# Patient Record
Sex: Female | Born: 1995
Health system: Southern US, Community
[De-identification: ages and names within clinical notes are randomized; demographics above are authoritative.]

## PROBLEM LIST (undated history)

## (undated) DIAGNOSIS — I871 Compression of vein: Secondary | ICD-10-CM

## (undated) HISTORY — DX: Compression of vein: I87.1

## (undated) HISTORY — PX: NEPHRECTOMY: SHX65

## (undated) HISTORY — PX: TONSILLECTOMY: SUR1361

## (undated) HISTORY — PX: OVARIAN CYST REMOVAL: SHX89

---

## 2020-04-20 DIAGNOSIS — Z905 Acquired absence of kidney: Secondary | ICD-10-CM

## 2020-04-20 DIAGNOSIS — N96 Recurrent pregnancy loss: Secondary | ICD-10-CM | POA: Insufficient documentation

## 2020-04-20 HISTORY — DX: Recurrent pregnancy loss: N96

## 2020-04-20 HISTORY — DX: Acquired absence of kidney: Z90.5

## 2020-04-20 LAB — OB RESULTS CONSOLE HIV ANTIBODY (ROUTINE TESTING): HIV: NONREACTIVE

## 2020-04-20 LAB — OB RESULTS CONSOLE HEPATITIS B SURFACE ANTIGEN: Hepatitis B Surface Ag: NEGATIVE

## 2020-04-20 LAB — OB RESULTS CONSOLE VARICELLA ZOSTER ANTIBODY, IGG: Varicella: IMMUNE

## 2020-04-20 LAB — OB RESULTS CONSOLE ABO/RH: RH Type: POSITIVE

## 2020-04-20 LAB — OB RESULTS CONSOLE ANTIBODY SCREEN: Antibody Screen: NEGATIVE

## 2020-04-20 LAB — OB RESULTS CONSOLE PLATELET COUNT: Platelets: 328

## 2020-04-20 LAB — OB RESULTS CONSOLE HGB/HCT, BLOOD
HCT: 38 (ref 29–41)
Hemoglobin: 12.7

## 2020-04-20 LAB — OB RESULTS CONSOLE RPR: RPR: NONREACTIVE

## 2020-04-20 LAB — OB RESULTS CONSOLE RUBELLA ANTIBODY, IGM: Rubella: IMMUNE

## 2020-04-20 LAB — OB RESULTS CONSOLE TSH: TSH: 0.543

## 2020-04-22 LAB — CYTOLOGY - PAP: Pap: NEGATIVE

## 2020-07-06 ENCOUNTER — Encounter: Payer: Self-pay | Admitting: *Deleted

## 2020-07-08 ENCOUNTER — Encounter: Payer: Self-pay | Admitting: General Practice

## 2020-07-13 ENCOUNTER — Encounter: Payer: Self-pay | Admitting: Certified Nurse Midwife

## 2020-07-13 ENCOUNTER — Ambulatory Visit (INDEPENDENT_AMBULATORY_CARE_PROVIDER_SITE_OTHER): Payer: 59 | Admitting: Certified Nurse Midwife

## 2020-07-13 ENCOUNTER — Other Ambulatory Visit: Payer: Self-pay

## 2020-07-13 VITALS — BP 106/69 | HR 74 | Ht 65.0 in

## 2020-07-13 DIAGNOSIS — O0990 Supervision of high risk pregnancy, unspecified, unspecified trimester: Secondary | ICD-10-CM

## 2020-07-13 DIAGNOSIS — N949 Unspecified condition associated with female genital organs and menstrual cycle: Secondary | ICD-10-CM

## 2020-07-13 DIAGNOSIS — Z3A19 19 weeks gestation of pregnancy: Secondary | ICD-10-CM

## 2020-07-13 HISTORY — DX: Supervision of high risk pregnancy, unspecified, unspecified trimester: O09.90

## 2020-07-13 LAB — POCT URINALYSIS DIP (DEVICE)
Bilirubin Urine: NEGATIVE
Glucose, UA: NEGATIVE mg/dL
Hgb urine dipstick: NEGATIVE
Ketones, ur: 15 mg/dL — AB
Leukocytes,Ua: NEGATIVE
Nitrite: NEGATIVE
Protein, ur: NEGATIVE mg/dL
Specific Gravity, Urine: 1.025 (ref 1.005–1.030)
Urobilinogen, UA: 0.2 mg/dL (ref 0.0–1.0)
pH: 7 (ref 5.0–8.0)

## 2020-07-13 MED ORDER — MAG-OXIDE 200 MG PO TABS: 400.0000 mg | ORAL_TABLET | Freq: Every day | ORAL | 3 refills | Status: DC

## 2020-07-13 NOTE — Progress Notes (Signed)
   PRENATAL VISIT NOTE  Subjective:  Barbara Reed is a 24 y.o. G5P0040 at [redacted]w[redacted]d being seen today for her first prenatal visit for this pregnancy.  She is currently monitored for the following issues for this high-risk pregnancy and has Supervision of high risk pregnancy, antepartum on their problem list.  Patient reports intermittent abdominal pains, usually with movement and occasionally with stress.   .  .   . Denies leaking of fluid.   She is planning to breastfeed. She is unsure about contraception.   The following portions of the patient's history were reviewed and updated as appropriate: allergies, current medications, past family history, past medical history, past social history, past surgical history and problem list.   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  Objective:  There were no vitals filed for this visit.  Fetal Status:           General:  Alert, oriented and cooperative. Patient is in no acute distress.  Skin: Skin is warm and dry. No rash noted.   Cardiovascular: Normal heart rate and rhythm noted  Respiratory: Normal respiratory effort, no problems with respiration noted. Clear to auscultation.   Abdomen: Soft, gravid, appropriate for gestational age. Normal bowel sounds. Non-tender.       Pelvic: Cervical exam deferred       Normal cervical contour, no lesions, no bleeding following pap, normal discharge  Extremities: Normal range of motion.     Mental Status: Normal mood and affect. Normal behavior. Normal judgment and thought content.   Assessment and Plan:  Pregnancy: G5P0040 at [redacted]w[redacted]d 1. Supervision of high risk pregnancy, antepartum - Blood work  completed at previous OB, results on file - [facility] AMB BABYSCRIPTS SCHEDULE OPTIMIZATION - Culture, OB Urine - Discussed the normal visit cadence for prenatal care including in-person and virtual visits - Discussed the nature of our practice with multiple providers including residents and students - Encouraged vaccination against Covid-19, patient declined. She had Covid in May 2021. - Also encouraged vaccination against flu, pt prefers to wait until later in the flu season.  2. [redacted] weeks gestation of pregnancy - [place] MFM OB DETAIL +14 WK; Future  3. Round ligament pain - Magnesium Oxide (MAG-OXIDE) 200 MG TABS; Take 2 tablets (400 mg total) by mouth at bedtime. If that amount causes loose stools in the am, switch to 200mg  daily at bedtime.  Dispense: 60 tablet; Refill: 3 - Discussed how scar tissue and round ligaments stretch and can cause pain during pregnancy as the uterus grows. Encouraged ways to decrease the pain.  Preterm labor/first trimester warning symptoms and general obstetric precautions including but not limited to vaginal bleeding, contractions, leaking of fluid and fetal movement were reviewed in detail with the patient. Please refer to After Visit Summary for other counseling recommendations.   Return in about 4 weeks (around 08/10/2020) for VIRTUAL, HROB.  Future Appointments  Date Time Provider Department Center  07/23/2020 10:45 AM [facility] NURSE [facility]  07/23/2020 11:00 AM [facility] US1 [facility]  08/10/2020  9:15 AM 10/10/2020, MD [facility]   [facility] CNM, MSN, [facility] 07/13/20 1:20 PM

## 2020-07-15 LAB — URINE CULTURE, OB REFLEX

## 2020-07-15 LAB — CULTURE, OB URINE

## 2020-07-23 ENCOUNTER — Ambulatory Visit: Payer: 59 | Attending: Certified Nurse Midwife

## 2020-07-23 ENCOUNTER — Other Ambulatory Visit: Payer: Self-pay | Admitting: *Deleted

## 2020-07-23 ENCOUNTER — Other Ambulatory Visit: Payer: Self-pay

## 2020-07-23 ENCOUNTER — Ambulatory Visit: Payer: 59 | Admitting: *Deleted

## 2020-07-23 DIAGNOSIS — Z3A19 19 weeks gestation of pregnancy: Secondary | ICD-10-CM | POA: Diagnosis not present

## 2020-07-23 DIAGNOSIS — Z3A20 20 weeks gestation of pregnancy: Secondary | ICD-10-CM | POA: Diagnosis not present

## 2020-07-23 DIAGNOSIS — Z362 Encounter for other antenatal screening follow-up: Secondary | ICD-10-CM

## 2020-07-23 DIAGNOSIS — O0992 Supervision of high risk pregnancy, unspecified, second trimester: Secondary | ICD-10-CM | POA: Insufficient documentation

## 2020-07-23 DIAGNOSIS — O0990 Supervision of high risk pregnancy, unspecified, unspecified trimester: Secondary | ICD-10-CM

## 2020-08-10 ENCOUNTER — Encounter: Payer: Self-pay | Admitting: Family Medicine

## 2020-08-10 ENCOUNTER — Other Ambulatory Visit: Payer: Self-pay

## 2020-08-10 ENCOUNTER — Ambulatory Visit (INDEPENDENT_AMBULATORY_CARE_PROVIDER_SITE_OTHER): Payer: 59 | Admitting: Family Medicine

## 2020-08-10 VITALS — BP 106/75 | HR 92 | Wt 144.6 lb

## 2020-08-10 DIAGNOSIS — R002 Palpitations: Secondary | ICD-10-CM

## 2020-08-10 DIAGNOSIS — N96 Recurrent pregnancy loss: Secondary | ICD-10-CM

## 2020-08-10 DIAGNOSIS — Z1331 Encounter for screening for depression: Secondary | ICD-10-CM

## 2020-08-10 DIAGNOSIS — O0990 Supervision of high risk pregnancy, unspecified, unspecified trimester: Secondary | ICD-10-CM

## 2020-08-10 DIAGNOSIS — Z905 Acquired absence of kidney: Secondary | ICD-10-CM

## 2020-08-10 NOTE — Patient Instructions (Addendum)
AREA PEDIATRIC/FAMILY PRACTICE PHYSICIANS  [place] ([address]) . [facility] o Chambliss, MD; Eniola, MD; Hale, MD; Hensel, MD; McDiarmid, MD; McIntyer, MD; Neal, MD; Walden, MD o [address] [place], [place] [address] o [phone] o Mon-Fri 8:30-12:30, 1:30-5:00 o Providers come to see babies at [facility] o Accepting Medicaid . [facility] o Limited providers who accept newborns: Koirala, MD; Morrow, MD; Wolters, MD o [address], [place], [place] [address] o [phone] o Mon-Fri 8:00-5:30 o Babies seen by providers at [facility] o Does NOT accept Medicaid o Please call early in hospitalization for appointment (limited availability)  . [facility] o [place], MD o [address] [place], [place] [address] o [phone] o Mon, Tue, Thur, Fri 8:30-5:00, Wed 10:00-7:00 (closed 1-2pm) o Babies seen by [facility] providers o Accepting Medicaid . [facility] o Rubin, MD o [address], [place], [place] [address] o [phone] o Mon-Fri 8:30-5:00, Sat 8:30-12:00 o Provider comes to see babies at [facility] o Accepting Medicaid o Must have been referred from current patients or contacted office prior to delivery . [facility] ([facility]) o Brown, MD; Chandler, MD; Ettefagh, MD; Grant, MD; Lester, MD; McCormick, MD; McQueen, MD; Prose, MD; Simha, MD; Stanley, MD; Stryffeler, NP; Tebben, NP o [address], [place], [place] [address] o [phone] o Mon, Tue, Thur, Fri 8:30-5:30, Wed 9:30-5:30, Sat 8:30-12:30 o Babies seen by [facility] providers o Accepting Medicaid o Only accepting infants of first-time parents or siblings of current patients o Hospital discharge coordinator will make follow-up appointment . Jack Amos o [address],  [place], [place]  [address] o [phone]   Fax - [phone] . [facility] o [address], [place], [place]  [address] o Phone - [phone]   Fax - [phone] . Shilpa Gosrani o [address], [place], [place]  [address] o [phone]  [place] ([address]) . [facility] o Bates, MD; Brassfield, MD; Cooper, Cox, MD; MD; Davis, MD; Dovico, MD; Ettefaugh, MD; Little, MD; Lowe, MD; Keiffer, MD; Melvin, MD; Sumner, MD; Williams, MD o [address], [place], [place] [address] o [phone] o Mon-Fri 8:30-5:00 (extended evenings Mon-Thur as needed), Sat-Sun 10:00-1:00 o Providers come to see babies at [facility] o Accepting Medicaid for families of first-time babies and families with all children in the household age 3 and under. Must register with office prior to making appointment (M-F only). . [facility] o Henson, NP; Knapp, MD; Lalonde, MD; Tysinger, PA o [address] [place], [place] [address] o [phone] o Mon-Fri 8:00-5:00 o Babies seen by providers at [facility] o Does NOT accept Medicaid/Commercial Insurance Only . [facility] ([facility]  o Artis, MD; Barnes, MD; Bratton, MD; Coccaro, MD; [place] Gardner, MD; Kramer, MD; Marshall, MD; Netherton, MD; Poleto, MD; Skinner, MD o [address] [place], [place] [address] o [phone] o Mon-Fri 8:30-5:30, Sat (Oct.-Mar.) 9:00-1:00 o Babies seen by providers at [facility] o Accepting Medicaid  [place] ([address]) . [facility]  o Reid, MD; Warner, MD o [address], , [place] [address] o [phone] o Mon-Fri 8:30-5:00, Sat 8:30-12:00 o Providers come to see babies at [facility] o Does NOT accept Medicaid . [facility] o Becker, PA; Hagler, MD; Scifres, PA; Sun, MD; Swayne, MD o [address],  [place], [place] [address] o [phone] o Mon-Fri 8:00-5:00 o Babies seen by providers at [facility] o Does NOT accept Medicaid o Only accepting babies of parents who are patients o Please call early in hospitalization for appointment (limited availability) . [facility] o Clark, MD; Frye, MD; Kelleher, MD; Mack, NP; Miller, MD; O'Keller, MD; Patterson, NP; Pudlo, MD; Puzio, MD; Thomas, MD; Tucker, MD; Twiselton, MD o [address], [place], [place] [address] o [phone] o Mon-Fri 8:00-5:00, Sat 9:00-12:00 o Providers come to see babies at [facility] o Does NOT accept Medicaid  [place] ([address]) . [facility] o Limited providers accepting new patients: Brake, NP; Wharton, PA o [address], [place], [place] [address] o [phone] o Mon-Fri 8:00-5:00 o Babies seen by providers at [facility] o Does NOT accept Medicaid o Only accepting babies of parents who are patients o Please call early in hospitalization for appointment (limited availability) . [facility] o Gay, MD; Quinlan, MD o [address] [place], [place] [address] o [phone] (press 1 to schedule appointment) o Mon-Fri 8:00-5:00 o Providers come to see babies at [facility] o Does NOT accept Medicaid . [facility] o Mazer, MD o [address] [place], [place] [address] o [phone] o Mon-Fri 8:30-5:00 (lunch 12:30-1:00), extended hours by appointment only Wed 5:00-6:30 o Babies seen by [facility] providers o Accepting Medicaid . [facility] o Banks, MD; Jordan, MD; Koberlein, MD o [address], [place], [place] [address] o [phone] o Mon-Fri 8:00-5:00 o Babies seen by [facility] providers o Does NOT accept Medicaid .  [facility] o Parker, MD; Hunter, MD; Wallace, DO o [address] [place], [place]  [address] o [phone] o Mon-Fri 8:00-5:00 o Babies seen by [facility] providers o Does NOT accept Medicaid . [facility] o Brandon, PA; Brecken, PA; Christy, NP; Dees, MD; DeClaire, MD; DeWeese, MD; Hansen, NP; Mills, NP; Parrish, NP; Smoot, NP; Summer, MD; Vapne, MD o [address] [place], [place] [address] o [phone] o Mon-Fri 8:30-5:00, Sat 10:00-1:00 o Providers come to see babies at [facility] o Does NOT accept Medicaid o Free prenatal information session Tuesdays at 4:45pm . [facility] o Bouska, MD; Gordon, PA; Jeffery, PA; Weber, PA o [address] [place] [address] o [phone] o Mon-Fri 7:30-5:30 o Babies seen by [facility] providers . [facility] o [address], [place], [place]  [address] o [phone]   Fax - [phone]  [place] ([address] & [address]) . [facility] o [place]ese, MD o [address] [place], [place] [address] o [phone] o Mon-Thur 8:00-6:00 o Providers come to see babies at [facility] o Accepting Medicaid . [facility] o Anderson, NP; Badger, MD; Beal, PA; Spencer, PA o [address] [place], [place] [address] o [phone] o Mon-Thur 7:30-7:30, Fri 7:30-4:30 o Babies seen by [facility] providers o Accepting Medicaid . [facility] o Agbuya, MD; Klett, NP; Romgoolam, MD o [address], [place],  [address] o [phone] o Mon-Fri 8:30-5:00, Sat 8:30-12:00 o Providers come to see babies at [facility] o Accepting Medicaid o Must have "Meet & Greet" appointment at office prior to delivery . [facility] o McCord,   MD; Wallace, MD; Wood, MD o [address], [place], [place] [address] o [phone] o Mon-Wed 8:00-6:00, Thur-Fri 8:00-5:00, Sat 9:00-12:00 o Providers come to  see babies at [facility] o Does NOT accept Medicaid o Only accepting siblings of current patients . [facility]  o [address], [place], [place]  [address] o [phone]   Fax - [phone] . [facility] o [address], [place], [place]  [address] o [phone]   Fax - [phone]  [place] ([address] & [address]) . [facility] o Cirigliano, DO; Matthews, DO o [address] [place], [place] [address] o [phone] o Mon-Fri 7:00-5:00 o Babies seen by [facility] providers o Does NOT accept Medicaid . [facility] o Briscoe, MD; Howley, PA; Moreira, PA o [address], [place], [place] [address] o [phone] o Mon-Fri 8:00-5:00 o Babies seen by [facility] providers o Accepting Medicaid . [facility] o Boyd, MD; Church, PA; Jones, NP; Osborn, PA o [address], [place], [place] [address] o [phone] o Mon-Fri 8:00-5:00 o Babies seen by providers at [facility] o Accepting Medicaid  [place] ([address]) . [facility] [place] o [place], DO o [address] [place], [place] [address] o [phone] o Mon-Fri 8:00-5:00 o Babies seen by [facility] providers o Does NOT accept Medicaid o Limited availability, please call early in hospitalization to schedule follow-up . [facility] o Calderon, PA; Cummings, MD; Dillard, MD; Martin, PA; Olson, MD; VanDeven, PA o [address] [place] [address], [place], [place] [address] o [phone] o Mon-Fri 8:30-5:00, Sat 9:00-12:00 o Babies seen by providers at [facility] o Accepting Medicaid o Please register online then schedule online or call office o [email or web address] . [facility] o Hunter, NP; Kumar, MD; Martin Rogers, PA o [address], [place], [place] [address] o [phone] o Mon-Fri 8:00-5:00 o Babies seen by providers at [facility] o Accepting Medicaid . [facility] ([facility] o [place], MD; Kristi Fleenor, NP; West, MD o [address], [place], [place] [address] o [phone] o Mon-Fri 8:00-5:30, Sat&Sun by appointment (phones open at 8:30) o Babies seen by [facility] providers o Accepting Medicaid o Must be a first-time baby or sibling of current patient . [facility] [place]  o [address], [place], [place]  [address] o [phone]   Fax - [phone]  [place] ([address]) . [facility] o Brown, PA; Cowen, PA; Rice, MD; Helton, PA; Spry, MD o [address] [place], [place] [address] o [phone] o Mon-Thur 8:00-7:00, Fri 8:00-5:00, Sat 8:00-12:00, Sun 9:00-12:00 o Babies seen by [facility] providers o Accepting Medicaid . [facility] o Coe-Goins, MD; Marshall, MD; Pierre-Louis, MD o [address] [place] [address], [place], [place] [address] o [phone] o Mon-Thur 8:00-5:00 o Babies seen by providers at [facility] o Accepting Medicaid . [facility] o Bratton, MD; Coe-Goins, MD; Hayes, MD; Lewis, MD; List, MD; Lott, MD; Marshall, MD; Moran, MD; O'Neal, MD; Pierre-Louis, MD; Pitonzo, MD; Scholer, MD; Spangle, MD o [address] [place],    [address] o [phone] o Mon-Fri 8:00-5:30, Sat (Oct.-Mar.) 9:00-1:00 o Babies seen by providers at [facility] o Accepting Medicaid o Must fill out new patient packet, available online at [email or web address] . [facility] ([facility] o Friddle, NP; Harris, NP; Kelly, NP; Logan, MD;  Melvin, PA; Poth, MD; Ramadoss, MD; Stanton, NP o [address], [place], [place] [address] o [phone] o Mon-Thur 8:00-5:30, Fri 8:00-5:00 o Babies seen by providers at [facility] o Accepting Medicaid  [place] ([address]) . [facility] o Dixon, PA; Leary, MD; Pickard, MD; Tapia, PA o [address], [place], [place] [address] o [phone] o Mon-Fri 8:00-5:00 o Babies seen by providers at [facility] o Accepting Medicaid   [place] ([address]) . [facility] [place] o Masneri, DO; Meyers, MD; Nelson, PA o [address], [place], [place] [address] o [phone] o Mon-Fri 8:00-5:00 o Babies seen by providers at [facility] o Does NOT accept Medicaid o Limited appointment availability, please call early in hospitalization  . [facility] [place] o Kunedd, DO; McGowen, MD o [address], [place], [place] [address] o [phone] o Mon-Fri 8:00-5:00 o Babies seen by [facility] providers o Does NOT accept Medicaid . [facility] [place] o Cameron, MD; MacDonald, MD; Michaels, PA; Nayak, MD o [address] [place] [address], [place], [place] [address] o [phone] o Mon-Fri 8:00-5:00 o After hours clinic ([address] [place], [place] [address]) [phone] Mon-Fri 5:00-8:00, Sat 12:00-6:00, Sun 10:00-4:00 o Babies seen by [facility] providers o Accepting Medicaid . [facility] [place] o [address], [place], [place]  [address] o [phone]   Fax - [phone]  [place] ([address]) . [facility] [place] [facility] o Andy, MD o [address], [place], [place] [address] o [phone] o Mon-Fri 8:00-5:00 o Babies seen by [facility] providers o Does NOT accept Medicaid . [facility] [place] ([facility] [place]) o Eksir, MD o [address], [place],   [address] o [phone] o Mon-Thur 8:00-7:00, Fri 8:00-5:00, Sat 8:00-12:00 o Babies seen by providers at [facility] o Accepting Medicaid - but does not have vaccinations in office (must be received elsewhere) o Limited availability, please call early in hospitalization  [place] ([address]) . [facility]  o Charlene Flemming, MD o [address], [place]  [address] o [phone]  Fax [phone]   Breastfeeding  Choosing to breastfeed is one of the best decisions you can make for yourself and your baby. A change in hormones during pregnancy causes your breasts to make breast milk in your milk-producing glands. Hormones prevent breast milk from being released before your baby is born. They also prompt milk flow after birth. Once breastfeeding has begun, thoughts of your baby, as well as his or her sucking or crying, can stimulate the release of milk from your milk-producing glands. Benefits of breastfeeding Research shows that breastfeeding offers many health benefits for infants and mothers. It also offers a cost-free and convenient way to feed your baby. For your baby  Your first milk (colostrum) helps your baby's digestive system to function better.  Special cells in your milk (antibodies) help your baby to fight off infections.  Breastfed babies are less likely to develop asthma, allergies, obesity, or type 2 diabetes. They are also at lower risk for   sudden infant death syndrome (SIDS).  Nutrients in breast milk are better able to meet your baby's needs compared to infant formula.  Breast milk improves your baby's brain development. For you  Breastfeeding helps to create a very special bond between you and your baby.  Breastfeeding is convenient. Breast milk costs nothing and is always available at the correct temperature.  Breastfeeding helps to burn calories. It helps you to lose the weight that you gained during pregnancy.  Breastfeeding makes your  uterus return faster to its size before pregnancy. It also slows bleeding (lochia) after you give birth.  Breastfeeding helps to lower your risk of developing type 2 diabetes, osteoporosis, rheumatoid arthritis, cardiovascular disease, and breast, ovarian, uterine, and endometrial cancer later in life. Breastfeeding basics Starting breastfeeding  Find a comfortable place to sit or lie down, with your neck and back well-supported.  Place a pillow or a rolled-up blanket under your baby to bring him or her to the level of your breast (if you are seated). Nursing pillows are specially designed to help support your arms and your baby while you breastfeed.  Make sure that your baby's tummy (abdomen) is facing your abdomen.  Gently massage your breast. With your fingertips, massage from the outer edges of your breast inward toward the nipple. This encourages milk flow. If your milk flows slowly, you may need to continue this action during the feeding.  Support your breast with 4 fingers underneath and your thumb above your nipple (make the letter "C" with your hand). Make sure your fingers are well away from your nipple and your baby's mouth.  Stroke your baby's lips gently with your finger or nipple.  When your baby's mouth is open wide enough, quickly bring your baby to your breast, placing your entire nipple and as much of the areola as possible into your baby's mouth. The areola is the colored area around your nipple. ? More areola should be visible above your baby's upper lip than below the lower lip. ? Your baby's lips should be opened and extended outward (flanged) to ensure an adequate, comfortable latch. ? Your baby's tongue should be between his or her lower gum and your breast.  Make sure that your baby's mouth is correctly positioned around your nipple (latched). Your baby's lips should create a seal on your breast and be turned out (everted).  It is common for your baby to suck about  2-3 minutes in order to start the flow of breast milk. Latching Teaching your baby how to latch onto your breast properly is very important. An improper latch can cause nipple pain, decreased milk supply, and poor weight gain in your baby. Also, if your baby is not latched onto your nipple properly, he or she may swallow some air during feeding. This can make your baby fussy. Burping your baby when you switch breasts during the feeding can help to get rid of the air. However, teaching your baby to latch on properly is still the best way to prevent fussiness from swallowing air while breastfeeding. Signs that your baby has successfully latched onto your nipple  Silent tugging or silent sucking, without causing you pain. Infant's lips should be extended outward (flanged).  Swallowing heard between every 3-4 sucks once your milk has started to flow (after your let-down milk reflex occurs).  Muscle movement above and in front of his or her ears while sucking. Signs that your baby has not successfully latched onto your nipple  Sucking sounds or   smacking sounds from your baby while breastfeeding.  Nipple pain. If you think your baby has not latched on correctly, slip your finger into the corner of your baby's mouth to break the suction and place it between your baby's gums. Attempt to start breastfeeding again. Signs of successful breastfeeding Signs from your baby  Your baby will gradually decrease the number of sucks or will completely stop sucking.  Your baby will fall asleep.  Your baby's body will relax.  Your baby will retain a small amount of milk in his or her mouth.  Your baby will let go of your breast by himself or herself. Signs from you  Breasts that have increased in firmness, weight, and size 1-3 hours after feeding.  Breasts that are softer immediately after breastfeeding.  Increased milk volume, as well as a change in milk consistency and color by the fifth day of  breastfeeding.  Nipples that are not sore, cracked, or bleeding. Signs that your baby is getting enough milk  Wetting at least 1-2 diapers during the first 24 hours after birth.  Wetting at least 5-6 diapers every 24 hours for the first week after birth. The urine should be clear or pale yellow by the age of 5 days.  Wetting 6-8 diapers every 24 hours as your baby continues to grow and develop.  At least 3 stools in a 24-hour period by the age of 5 days. The stool should be soft and yellow.  At least 3 stools in a 24-hour period by the age of 7 days. The stool should be seedy and yellow.  No loss of weight greater than 10% of birth weight during the first 3 days of life.  Average weight gain of 4-7 oz (113-198 g) per week after the age of 4 days.  Consistent daily weight gain by the age of 5 days, without weight loss after the age of 2 weeks. After a feeding, your baby may spit up a small amount of milk. This is normal. Breastfeeding frequency and duration Frequent feeding will help you make more milk and can prevent sore nipples and extremely full breasts (breast engorgement). Breastfeed when you feel the need to reduce the fullness of your breasts or when your baby shows signs of hunger. This is called "breastfeeding on demand." Signs that your baby is hungry include:  Increased alertness, activity, or restlessness.  Movement of the head from side to side.  Opening of the mouth when the corner of the mouth or cheek is stroked (rooting).  Increased sucking sounds, smacking lips, cooing, sighing, or squeaking.  Hand-to-mouth movements and sucking on fingers or hands.  Fussing or crying. Avoid introducing a pacifier to your baby in the first 4-6 weeks after your baby is born. After this time, you may choose to use a pacifier. Research has shown that pacifier use during the first year of a baby's life decreases the risk of sudden infant death syndrome (SIDS). Allow your baby to feed  on each breast as long as he or she wants. When your baby unlatches or falls asleep while feeding from the first breast, offer the second breast. Because newborns are often sleepy in the first few weeks of life, you may need to awaken your baby to get him or her to feed. Breastfeeding times will vary from baby to baby. However, the following rules can serve as a guide to help you make sure that your baby is properly fed:  Newborns (babies 4 weeks of age or   younger) may breastfeed every 1-3 hours.  Newborns should not go without breastfeeding for longer than 3 hours during the day or 5 hours during the night.  You should breastfeed your baby a minimum of 8 times in a 24-hour period. Breast milk pumping     Pumping and storing breast milk allows you to make sure that your baby is exclusively fed your breast milk, even at times when you are unable to breastfeed. This is especially important if you go back to work while you are still breastfeeding, or if you are not able to be present during feedings. Your lactation consultant can help you find a method of pumping that works best for you and give you guidelines about how long it is safe to store breast milk. Caring for your breasts while you breastfeed Nipples can become dry, cracked, and sore while breastfeeding. The following recommendations can help keep your breasts moisturized and healthy:  Avoid using soap on your nipples.  Wear a supportive bra designed especially for nursing. Avoid wearing underwire-style bras or extremely tight bras (sports bras).  Air-dry your nipples for 3-4 minutes after each feeding.  Use only cotton bra pads to absorb leaked breast milk. Leaking of breast milk between feedings is normal.  Use lanolin on your nipples after breastfeeding. Lanolin helps to maintain your skin's normal moisture barrier. Pure lanolin is not harmful (not toxic) to your baby. You may also hand express a few drops of breast milk and gently  massage that milk into your nipples and allow the milk to air-dry. In the first few weeks after giving birth, some women experience breast engorgement. Engorgement can make your breasts feel heavy, warm, and tender to the touch. Engorgement peaks within 3-5 days after you give birth. The following recommendations can help to ease engorgement:  Completely empty your breasts while breastfeeding or pumping. You may want to start by applying warm, moist heat (in the shower or with warm, water-soaked hand towels) just before feeding or pumping. This increases circulation and helps the milk flow. If your baby does not completely empty your breasts while breastfeeding, pump any extra milk after he or she is finished.  Apply ice packs to your breasts immediately after breastfeeding or pumping, unless this is too uncomfortable for you. To do this: ? Put ice in a plastic bag. ? Place a towel between your skin and the bag. ? Leave the ice on for 20 minutes, 2-3 times a day.  Make sure that your baby is latched on and positioned properly while breastfeeding. If engorgement persists after 48 hours of following these recommendations, contact your health care provider or a lactation consultant. Overall health care recommendations while breastfeeding  Eat 3 healthy meals and 3 snacks every day. Well-nourished mothers who are breastfeeding need an additional 450-500 calories a day. You can meet this requirement by increasing the amount of a balanced diet that you eat.  Drink enough water to keep your urine pale yellow or clear.  Rest often, relax, and continue to take your prenatal vitamins to prevent fatigue, stress, and low vitamin and mineral levels in your body (nutrient deficiencies).  Do not use any products that contain nicotine or tobacco, such as cigarettes and e-cigarettes. Your baby may be harmed by chemicals from cigarettes that pass into breast milk and exposure to secondhand smoke. If you need help  quitting, ask your health care provider.  Avoid alcohol.  Do not use illegal drugs or marijuana.  Talk with your   health care provider before taking any medicines. These include over-the-counter and prescription medicines as well as vitamins and herbal supplements. Some medicines that may be harmful to your baby can pass through breast milk.  It is possible to become pregnant while breastfeeding. If birth control is desired, ask your health care provider about options that will be safe while breastfeeding your baby. Where to find more information: La [employer]: www.llli.org Contact a health care provider if:  You feel like you want to stop breastfeeding or have become frustrated with breastfeeding.  Your nipples are cracked or bleeding.  Your breasts are red, tender, or warm.  You have: ? Painful breasts or nipples. ? A swollen area on either breast. ? A fever or chills. ? Nausea or vomiting. ? Drainage other than breast milk from your nipples.  Your breasts do not become full before feedings by the fifth day after you give birth.  You feel sad and depressed.  Your baby is: ? Too sleepy to eat well. ? Having trouble sleeping. ? More than 1 week old and wetting fewer than 6 diapers in a 24-hour period. ? Not gaining weight by 5 days of age.  Your baby has fewer than 3 stools in a 24-hour period.  Your baby's skin or the white parts of his or her eyes become yellow. Get help right away if:  Your baby is overly tired (lethargic) and does not want to wake up and feed.  Your baby develops an unexplained fever. Summary  Breastfeeding offers many health benefits for infant and mothers.  Try to breastfeed your infant when he or she shows early signs of hunger.  Gently tickle or stroke your baby's lips with your finger or nipple to allow the baby to open his or her mouth. Bring the baby to your breast. Make sure that much of the areola is in your baby's mouth.  Offer one side and burp the baby before you offer the other side.  Talk with your health care provider or lactation consultant if you have questions or you face problems as you breastfeed. This information is not intended to replace advice given to you by your health care provider. Make sure you discuss any questions you have with your health care provider. Document Revised: 01/10/2018 Document Reviewed: 11/17/2016 Elsevier Patient Education  2020 Elsevier Inc.  

## 2020-08-10 NOTE — Progress Notes (Signed)
   PRENATAL VISIT NOTE  Subjective:  Barbara Reed is a 24 y.o. G5P0040 at [redacted]w[redacted]d being seen today for ongoing prenatal care.  She is currently monitored for the following issues for this high-risk pregnancy and has Supervision of high risk pregnancy, antepartum; History of multiple miscarriages; and History of nephrectomy on their problem list.  Patient reports palpitations.  Contractions: Not present. Vag. Bleeding: None.  Movement: Present. Denies leaking of fluid.   The following portions of the patient's history were reviewed and updated as appropriate: allergies, current medications, past family history, past medical history, past social history, past surgical history and problem list.   Objective:   Vitals:   08/10/20 0924  BP: 106/75  Pulse: 92  Weight: 144 lb 9.6 oz (65.6 kg)    Fetal Status: Fetal Heart Rate (bpm): 150 Fundal Height: 23 cm Movement: Present     General:  Alert, oriented and cooperative. Patient is in no acute distress.  Skin: Skin is warm and dry. No rash noted.   Cardiovascular: Normal heart rate noted  Respiratory: Normal respiratory effort, no problems with respiration noted  Abdomen: Soft, gravid, appropriate for gestational age.  Pain/Pressure: Present     Pelvic: Cervical exam deferred        Extremities: Normal range of motion.  Edema: None  Mental Status: Normal mood and affect. Normal behavior. Normal judgment and thought content.   Assessment and Plan:  Pregnancy: G5P0040 at [redacted]w[redacted]d 1. Supervision of high risk pregnancy, antepartum 28 wk labs next visit Normal anatomy US  2. Positive depression screening To see Asher Muir - Ambulatory referral to Integrated Behavioral Health  3. 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 History of multiple miscarriages All 1st trimester  5. Palpitations Sounds like SVT--watch showed HR in the 160-170 range.  Carotid massage and submerging face in ice water to get her out of it, discussed. May  need some B-blocker, but will complete w/u. - Ambulatory referral to Cardiology  Preterm labor symptoms and general obstetric precautions including but not limited to vaginal bleeding, contractions, leaking of fluid and fetal movement were reviewed in detail with the patient. Please refer to After Visit Summary for other counseling recommendations.   Return in 4 weeks (on 09/07/2020) for [facility], in person, 28 wk labs.  Future Appointments  Date Time Provider Department Center  08/20/2020  8:15 AM [facility] HEALTH CLINICIAN [facility]  09/07/2020  8:35 AM Adam Phenix, MD [facility]  09/07/2020  9:30 AM [facility]  10/15/2020  7:15 AM [facility] NURSE [facility]  10/15/2020  7:30 AM [facility] US3 [facility]    Reva Bores, MD

## 2020-08-11 NOTE — BH Specialist Note (Signed)
Pt did not arrive to video visit and did not answer the phone ; Left HIPPA-compliant message to call back Asher Muir from Center for [employer] at [facility] at [phone] (main office) or [phone] (Izyan Ezzell's office).  ; pt does not have MyChart, so unable to leave MyChart message.

## 2020-08-20 ENCOUNTER — Ambulatory Visit: Payer: 59 | Admitting: Clinical

## 2020-08-20 DIAGNOSIS — Z91199 Patient's noncompliance with other medical treatment and regimen due to unspecified reason: Secondary | ICD-10-CM

## 2020-08-20 DIAGNOSIS — Z5329 Procedure and treatment not carried out because of patient's decision for other reasons: Secondary | ICD-10-CM

## 2020-08-20 NOTE — Progress Notes (Signed)
error 

## 2020-08-30 DIAGNOSIS — I871 Compression of vein: Secondary | ICD-10-CM | POA: Insufficient documentation

## 2020-08-31 ENCOUNTER — Ambulatory Visit (INDEPENDENT_AMBULATORY_CARE_PROVIDER_SITE_OTHER): Payer: 59 | Admitting: Cardiology

## 2020-08-31 ENCOUNTER — Other Ambulatory Visit: Payer: Self-pay

## 2020-08-31 ENCOUNTER — Ambulatory Visit (INDEPENDENT_AMBULATORY_CARE_PROVIDER_SITE_OTHER): Payer: 59

## 2020-08-31 ENCOUNTER — Encounter: Payer: Self-pay | Admitting: Cardiology

## 2020-08-31 VITALS — BP 108/68 | HR 104 | Ht 65.0 in | Wt 149.4 lb

## 2020-08-31 DIAGNOSIS — Z905 Acquired absence of kidney: Secondary | ICD-10-CM

## 2020-08-31 DIAGNOSIS — R011 Cardiac murmur, unspecified: Secondary | ICD-10-CM

## 2020-08-31 DIAGNOSIS — R002 Palpitations: Secondary | ICD-10-CM

## 2020-08-31 DIAGNOSIS — R000 Tachycardia, unspecified: Secondary | ICD-10-CM

## 2020-08-31 HISTORY — DX: Tachycardia, unspecified: R00.0

## 2020-08-31 HISTORY — DX: Palpitations: R00.2

## 2020-08-31 HISTORY — DX: Cardiac murmur, unspecified: R01.1

## 2020-08-31 NOTE — Patient Instructions (Signed)
Medication Instructions:  No medication changes. *If you need a refill on your cardiac medications before your next appointment, please call your pharmacy*   Lab Work: None ordered If you have labs (blood work) drawn today and your tests are completely normal, you will receive your results only by: Marland Kitchen MyChart Message (if you have MyChart) OR . A paper copy in the mail If you have any lab test that is abnormal or we need to change your treatment, we will call you to review the results.   Testing/Procedures: Your physician has requested that you have an echocardiogram. Echocardiography is a painless test that uses sound waves to create images of your heart. It provides your doctor with information about the size and shape of your heart and how well your heart's chambers and valves are working. This procedure takes approximately one hour. There are no restrictions for this procedure.   WHY IS MY DOCTOR PRESCRIBING ZIO? The Zio system is proven and trusted by physicians to detect and diagnose irregular heart rhythms -- and has been prescribed to hundreds of thousands of patients.  The FDA has cleared the Zio system to monitor for many different kinds of irregular heart rhythms. In a study, physicians were able to reach a diagnosis 90% of the time with the Zio system1.  You can wear the Zio monitor -- a small, discreet, comfortable patch -- during your normal day-to-day activity, including while you sleep, shower, and exercise, while it records every single heartbeat for analysis.  1Barrett, P., et al. Comparison of 24 Hour Holter Monitoring Versus 14 Day Novel Adhesive Patch Electrocardiographic Monitoring. American Journal of Medicine, 2014.  ZIO VS. HOLTER MONITORING The Zio monitor can be comfortably worn for up to 14 days. Holter monitors can be worn for 24 to 48 hours, limiting the time to record any irregular heart rhythms you may have. Zio is able to capture data for the 51% of patients  who have their first symptom-triggered arrhythmia after 48 hours.1  LIVE WITHOUT RESTRICTIONS The Zio ambulatory cardiac monitor is a small, unobtrusive, and water-resistant patch--you might even forget you're wearing it. The Zio monitor records and stores every beat of your heart, whether you're sleeping, working out, or showering.  Wear the monitor for 2 weeks, remove 09/14/20.    Follow-Up: At [facility], you and your health needs are our priority.  As part of our continuing mission to provide you with exceptional heart care, we have created designated Provider Care Teams.  These Care Teams include your primary Cardiologist (physician) and Advanced Practice Providers (APPs -  Physician Assistants and Nurse Practitioners) who all work together to provide you with the care you need, when you need it.  We recommend signing up for the patient portal called "MyChart".  Sign up information is provided on this After Visit Summary.  MyChart is used to connect with patients for Virtual Visits (Telemedicine).  Patients are able to view lab/test results, encounter notes, upcoming appointments, etc.  Non-urgent messages can be sent to your provider as well.   To learn more about what you can do with MyChart, go to [email or web address].    Your next appointment:   3 month(s)  The format for your next appointment:   In Person  Provider:   Belva Crome, MD   Other Instructions  Echocardiogram An echocardiogram is a procedure that uses painless sound waves (ultrasound) to produce an image of the heart. Images from an echocardiogram can provide important information about:  Signs  of coronary artery disease (CAD).  Aneurysm detection. An aneurysm is a weak or damaged part of an artery wall that bulges out from the normal force of blood pumping through the body.  Heart size and shape. Changes in the size or shape of the heart can be associated with certain conditions, including heart  failure, aneurysm, and CAD.  Heart muscle function.  Heart valve function.  Signs of a past heart attack.  Fluid buildup around the heart.  Thickening of the heart muscle.  A tumor or infectious growth around the heart valves. Tell a health care provider about:  Any allergies you have.  All medicines you are taking, including vitamins, herbs, eye drops, creams, and over-the-counter medicines.  Any blood disorders you have.  Any surgeries you have had.  Any medical conditions you have.  Whether you are pregnant or may be pregnant. What are the risks? Generally, this is a safe procedure. However, problems may occur, including:  Allergic reaction to dye (contrast) that may be used during the procedure. What happens before the procedure? No specific preparation is needed. You may eat and drink normally. What happens during the procedure?   An IV tube may be inserted into one of your veins.  You may receive contrast through this tube. A contrast is an injection that improves the quality of the pictures from your heart.  A gel will be applied to your chest.  A wand-like tool (transducer) will be moved over your chest. The gel will help to transmit the sound waves from the transducer.  The sound waves will harmlessly bounce off of your heart to allow the heart images to be captured in real-time motion. The images will be recorded on a computer. The procedure may vary among health care providers and hospitals. What happens after the procedure?  You may return to your normal, everyday life, including diet, activities, and medicines, unless your health care provider tells you not to do that. Summary  An echocardiogram is a procedure that uses painless sound waves (ultrasound) to produce an image of the heart.  Images from an echocardiogram can provide important information about the size and shape of your heart, heart muscle function, heart valve function, and fluid buildup  around your heart.  You do not need to do anything to prepare before this procedure. You may eat and drink normally.  After the echocardiogram is completed, you may return to your normal, everyday life, unless your health care provider tells you not to do that. This information is not intended to replace advice given to you by your health care provider. Make sure you discuss any questions you have with your health care provider. Document Revised: 02/06/2019 Document Reviewed: 11/18/2016 Elsevier Patient Education  [place].

## 2020-08-31 NOTE — Progress Notes (Signed)
Cardiology Office Note:    Date:  08/31/2020   ID:  Barbara Reed, DOB 09-03-1996, MRN [id number]  PCP:  Patient, No Pcp Per  Cardiologist:  Garwin Brothers, MD   Referring MD: Reva Bores, MD    ASSESSMENT:    1. History of nephrectomy   2. Palpitations   3. Sinus tachycardia   4. Cardiac murmur    PLAN:    In order of problems listed above:  1. Primary prevention stressed with the patient. 2. Palpitations: Sinus tachycardia: Her symptoms are suggestive of a possible benign etiology.  She mentions to me that she feels her palpitations and she is concerned about it.  She has never felt dizzy or lightheaded.  I told her about maneuvers such as lying down on her left side when she is tachycardic.  I told her about being hydrated adequately.  She is very willing to adopt these maneuvers to help her self.   3. Cardiac murmur: Echocardiogram will be done to assess murmur heard on auscultation. 4. For the issue of palpitations I will do a 2-week monitoring just to make sure she does not have any significant arrhythmias.  She tells me that her TSH was checked recently and she was told that it was fine. 5. She knows to go to the nearest emergency room for any concerning symptoms.Patient will be seen in follow-up appointment in 3 months or earlier if the patient has any concerns 6. I told her that I would prefer not to initiate her on any medications especially because of her pregnancy and what appears to be a benign symptom profile.  Benefits and risks of medications explained and she vocalized understanding and she concurs.  She prefers not to be any in any medications either.   Medication Adjustments/Labs and Tests Ordered: Current medicines are reviewed at length with the patient today.  Concerns regarding medicines are outlined above.  Orders Placed This Encounter  Procedures  . LONG TERM MONITOR (3-14 DAYS)  . EKG 12-Lead  . ECHOCARDIOGRAM COMPLETE   No orders of the defined  types were placed in this encounter.    History of Present Illness:    Barbara Reed is a 24 y.o. female who is being seen today for the evaluation of palpitations at the request of Reva Bores, MD.  Patient has past medical history of nephrectomy for nutcracker syndrome.  She tells me that she is [redacted] weeks pregnant.  She complains of some palpitations at times.  No chest pain orthopnea or PND no dizziness or any syncopal spells.  For this reason she is here for evaluation.  At the time of my evaluation, the patient is alert awake oriented and in no distress.  Past Medical History:  Diagnosis Date  . 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 Declined progesterone  . 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 Age 24 due to Nutcracker syndrome  . Nutcracker phenomenon of renal vein   . Supervision of high risk pregnancy, antepartum 07/13/2020    Nursing Staff Provider Office Location [facility] Dating   Language  English Anatomy US   Flu Vaccine  Declined 08/10/20 Genetic Screen  NIPS:   AFP:   First Screen:  Quad:   TDaP Vaccine    Hgb A1C or  GTT Early  Third trimester  COVID Vaccine    LAB RESULTS  Rhogam  NA Blood Type A/Positive/-- (06/22 0000)  Feeding Plan Breast Antibody Negative (06/22 0000) Contraception ?? Rubella Immune (06/22 0    Past Surgical History:  Procedure Laterality Date  . NEPHRECTOMY    . OVARIAN CYST REMOVAL    . TONSILLECTOMY      Current Medications: Current Meds  Medication Sig  . Prenatal Vit-Fe Fumarate-FA (PRENATAL VITAMINS PO) Take by mouth.     Allergies:   Patient has no known allergies.   Social History   Socioeconomic History  . Marital status: Married    Spouse name: Not on file  . Number of children: Not on file  . Years of education: Not on file  . Highest education level: Not on file  Occupational History  . Not on  file  Tobacco Use  . Smoking status: Never Smoker  . Smokeless tobacco: Never Used  Substance and Sexual Activity  . Alcohol use: Not Currently  . Drug use: Never  . Sexual activity: Not on file  Other Topics Concern  . Not on file  Social History Narrative  . Not on file   Social Determinants of Health   Financial Resource Strain:   . Difficulty of Paying Living Expenses: Not on file  Food Insecurity: No Food Insecurity  . Worried About Programme researcher, broadcasting/film/video in the Last Year: Never true  . Ran Out of Food in the Last Year: Never true  Transportation Needs: No Transportation Needs  . Lack of Transportation (Medical): No  . Lack of Transportation (Non-Medical): No  Physical Activity:   . Days of Exercise per Week: Not on file  . Minutes of Exercise per Session: Not on file  Stress:   . Feeling of Stress : Not on file  Social Connections:   . Frequency of Communication with Friends and Family: Not on file  . Frequency of Social Gatherings with Friends and Family: Not on file  . Attends Religious Services: Not on file  . Active Member of Clubs or Organizations: Not on file  . Attends Banker Meetings: Not on file  . Marital Status: Not on file     Family History: The patient's family history includes Cancer in her maternal grandmother and paternal grandmother; Hypertension in her paternal grandfather.  ROS:   Please see the history of present illness.    All other systems reviewed and are negative.  EKGs/Labs/Other Studies Reviewed:    The following studies were reviewed today: EKG reveals sinus rhythm and nonspecific ST-T changes and tachycardia   Recent Labs: 04/20/2020: Hemoglobin 12.7; Platelets 328; TSH 0.543  Recent Lipid Panel No results found for: CHOL, TRIG, HDL, CHOLHDL, VLDL, LDLCALC, LDLDIRECT  Physical Exam:    VS:  BP 108/68   Pulse (!) 104   Ht 5\' 5"  (1.651 m)   Wt 149 lb 6.4 oz (67.8 kg)   LMP 02/23/2020   SpO2 99%   BMI 24.86  kg/m     Wt Readings from Last 3 Encounters:  08/31/20 149 lb 6.4 oz (67.8 kg)  08/10/20 144 lb 9.6 oz (65.6 kg)     GEN: Patient is in no acute distress HEENT: Normal NECK: No JVD; No carotid bruits LYMPHATICS: No lymphadenopathy CARDIAC: S1 S2 regular, 2/6 systolic murmur at the apex. RESPIRATORY:  Clear to auscultation without rales, wheezing or rhonchi  ABDOMEN: Soft, non-tender, non-distended MUSCULOSKELETAL:  No edema; No deformity  SKIN: Warm and dry NEUROLOGIC:  Alert and oriented x 3 PSYCHIATRIC:  Normal affect  Signed, Garwin Brothers, MD  08/31/2020 2:08 PM    [facility]

## 2020-09-07 ENCOUNTER — Other Ambulatory Visit: Payer: 59

## 2020-09-07 ENCOUNTER — Ambulatory Visit (INDEPENDENT_AMBULATORY_CARE_PROVIDER_SITE_OTHER): Payer: 59 | Admitting: Obstetrics & Gynecology

## 2020-09-07 ENCOUNTER — Other Ambulatory Visit: Payer: Self-pay

## 2020-09-07 VITALS — BP 120/79 | HR 87 | Wt 149.6 lb

## 2020-09-07 DIAGNOSIS — O0990 Supervision of high risk pregnancy, unspecified, unspecified trimester: Secondary | ICD-10-CM

## 2020-09-07 DIAGNOSIS — Z23 Encounter for immunization: Secondary | ICD-10-CM

## 2020-09-07 DIAGNOSIS — I871 Compression of vein: Secondary | ICD-10-CM

## 2020-09-07 DIAGNOSIS — N96 Recurrent pregnancy loss: Secondary | ICD-10-CM

## 2020-09-07 DIAGNOSIS — Z905 Acquired absence of kidney: Secondary | ICD-10-CM

## 2020-09-07 MED ORDER — PANTOPRAZOLE SODIUM 20 MG PO TBEC: 20.0000 mg | DELAYED_RELEASE_TABLET | Freq: Every day | ORAL | 2 refills | Status: DC

## 2020-09-07 NOTE — Progress Notes (Signed)
   PRENATAL VISIT NOTE  Subjective:  Barbara Reed is a 24 y.o. G5P0040 at [redacted]w[redacted]d being seen today for ongoing prenatal care.  She is currently monitored for the following issues for this high-risk pregnancy and has Supervision of high risk pregnancy, antepartum; History of multiple miscarriages; History of nephrectomy; Nutcracker phenomenon of renal vein; Palpitations; Sinus tachycardia; and Cardiac murmur on their problem list.  Patient reports heartburn.  Contractions: Not present. Vag. Bleeding: None.  Movement: Present. Denies leaking of fluid.   The following portions of the patient's history were reviewed and updated as appropriate: allergies, current medications, past family history, past medical history, past social history, past surgical history and problem list.   Objective:   Vitals:   09/07/20 0837  BP: 120/79  Pulse: 87  Weight: 149 lb 9.6 oz (67.9 kg)    Fetal Status: Fetal Heart Rate (bpm): 160   Movement: Present     General:  Alert, oriented and cooperative. Patient is in no acute distress.  Skin: Skin is warm and dry. No rash noted.   Cardiovascular: Normal heart rate noted  Respiratory: Normal respiratory effort, no problems with respiration noted  Abdomen: Soft, gravid, appropriate for gestational age.  Pain/Pressure: Present     Pelvic: Cervical exam deferred        Extremities: Normal range of motion.  Edema: None  Mental Status: Normal mood and affect. Normal behavior. Normal judgment and thought content.   Assessment and Plan:  Pregnancy: G5P0040 at [redacted]w[redacted]d 1. Supervision of high risk pregnancy, antepartum Routine 28 week labs - Tdap vaccine greater than or equal to 7yo IM - pantoprazole (PROTONIX) 20 MG tablet; Take 1 tablet (20 mg total) by mouth daily.  Dispense: 30 tablet; Refill: 2  2. History of multiple miscarriages Normal pregnancy  3. History of nephrectomy Donated kidney age 34  Preterm labor symptoms and general obstetric precautions  including but not limited to vaginal bleeding, contractions, leaking of fluid and fetal movement were reviewed in detail with the patient. Please refer to After Visit Summary for other counseling recommendations.   Return in about 4 weeks (around 10/05/2020).  Future Appointments  Date Time Provider Department Center  09/07/2020  9:30 AM [facility]  09/16/2020  2:15 PM [facility] [facility] [place] 1 [facility] None  10/15/2020  7:15 AM [facility] NURSE [facility]  10/15/2020  7:30 AM [facility] US3 [facility]  12/02/2020  2:20 PM Revankar, Aundra Dubin, MD [facility] None    Scheryl Darter, MD

## 2020-09-07 NOTE — Patient Instructions (Signed)

## 2020-09-08 LAB — GLUCOSE TOLERANCE, 2 HOURS W/ 1HR
Glucose, 1 hour: 138 mg/dL (ref 65–179)
Glucose, 2 hour: 133 mg/dL (ref 65–152)
Glucose, Fasting: 56 mg/dL — ABNORMAL LOW (ref 65–91)

## 2020-09-08 LAB — CBC
Hematocrit: 35.1 % (ref 34.0–46.6)
Hemoglobin: 12 g/dL (ref 11.1–15.9)
MCH: 31.7 pg (ref 26.6–33.0)
MCHC: 34.2 g/dL (ref 31.5–35.7)
MCV: 93 fL (ref 79–97)
Platelets: 310 10*3/uL (ref 150–450)
RBC: 3.78 x10E6/uL (ref 3.77–5.28)
RDW: 12.3 % (ref 11.7–15.4)
WBC: 11.7 10*3/uL — ABNORMAL HIGH (ref 3.4–10.8)

## 2020-09-08 LAB — HIV ANTIBODY (ROUTINE TESTING W REFLEX): HIV Screen 4th Generation wRfx: NONREACTIVE

## 2020-09-08 LAB — RPR: RPR Ser Ql: NONREACTIVE

## 2020-09-09 ENCOUNTER — Telehealth: Payer: Self-pay | Admitting: *Deleted

## 2020-09-09 NOTE — Telephone Encounter (Addendum)
-----   Message from Adam Phenix, MD sent at 09/09/2020  9:38 AM EST ----- 2 hr GTT passed  Called pt and informed her of normal GTT as well as negative RPR and HIV test result.

## 2020-09-16 ENCOUNTER — Other Ambulatory Visit: Payer: 59

## 2020-10-05 ENCOUNTER — Other Ambulatory Visit: Payer: Self-pay

## 2020-10-05 ENCOUNTER — Ambulatory Visit (INDEPENDENT_AMBULATORY_CARE_PROVIDER_SITE_OTHER): Payer: 59 | Admitting: Obstetrics & Gynecology

## 2020-10-05 VITALS — BP 132/88 | HR 106 | Wt 154.7 lb

## 2020-10-05 DIAGNOSIS — O0990 Supervision of high risk pregnancy, unspecified, unspecified trimester: Secondary | ICD-10-CM

## 2020-10-05 DIAGNOSIS — N96 Recurrent pregnancy loss: Secondary | ICD-10-CM

## 2020-10-05 NOTE — Patient Instructions (Signed)

## 2020-10-05 NOTE — Progress Notes (Signed)
   PRENATAL VISIT NOTE  Subjective:  Barbara Reed is a 24 y.o. G5P0040 at [redacted]w[redacted]d being seen today for ongoing prenatal care.  She is currently monitored for the following issues for this high-risk pregnancy and has Supervision of high risk pregnancy, antepartum; History of multiple miscarriages; History of nephrectomy; Nutcracker phenomenon of renal vein; Palpitations; Sinus tachycardia; and Cardiac murmur on their problem list.  Patient reports no complaints.  Contractions: Irritability. Vag. Bleeding: None.  Movement: Present. Denies leaking of fluid.   The following portions of the patient's history were reviewed and updated as appropriate: allergies, current medications, past family history, past medical history, past social history, past surgical history and problem list.   Objective:   Vitals:   10/05/20 0844  BP: 132/88  Pulse: (!) 106  Weight: 154 lb 11.2 oz (70.2 kg)    Fetal Status: Fetal Heart Rate (bpm): 152   Movement: Present     General:  Alert, oriented and cooperative. Patient is in no acute distress.  Skin: Skin is warm and dry. No rash noted.   Cardiovascular: Normal heart rate noted  Respiratory: Normal respiratory effort, no problems with respiration noted  Abdomen: Soft, gravid, appropriate for gestational age.  Pain/Pressure: Present     Pelvic: Cervical exam deferred        Extremities: Normal range of motion.  Edema: None  Mental Status: Normal mood and affect. Normal behavior. Normal judgment and thought content.   Assessment and Plan:  Pregnancy: G5P0040 at [redacted]w[redacted]d 1. Supervision of high risk pregnancy, antepartum   2. History of multiple miscarriages   Preterm labor symptoms and general obstetric precautions including but not limited to vaginal bleeding, contractions, leaking of fluid and fetal movement were reviewed in detail with the patient. Please refer to After Visit Summary for other counseling recommendations.   Return in about 2 weeks  (around 10/19/2020).  Future Appointments  Date Time Provider Department Center  10/12/2020 11:15 AM [facility] [place] [facility] None  10/15/2020  7:15 AM [facility] NURSE [facility]  10/15/2020  7:30 AM [facility] US3 [facility]  12/02/2020  2:20 PM Revankar, Aundra Dubin, MD [facility] None    Scheryl Darter, MD

## 2020-10-12 ENCOUNTER — Other Ambulatory Visit: Payer: 59

## 2020-10-15 ENCOUNTER — Ambulatory Visit: Payer: 59

## 2020-10-18 ENCOUNTER — Ambulatory Visit (INDEPENDENT_AMBULATORY_CARE_PROVIDER_SITE_OTHER): Payer: 59 | Admitting: Obstetrics & Gynecology

## 2020-10-18 ENCOUNTER — Other Ambulatory Visit: Payer: Self-pay

## 2020-10-18 VITALS — BP 121/81 | HR 87 | Wt 158.2 lb

## 2020-10-18 DIAGNOSIS — O0990 Supervision of high risk pregnancy, unspecified, unspecified trimester: Secondary | ICD-10-CM

## 2020-10-18 DIAGNOSIS — N949 Unspecified condition associated with female genital organs and menstrual cycle: Secondary | ICD-10-CM

## 2020-10-18 DIAGNOSIS — R011 Cardiac murmur, unspecified: Secondary | ICD-10-CM

## 2020-10-18 NOTE — Progress Notes (Signed)
   PRENATAL VISIT NOTE  Subjective:  Victorina Kable is a 24 y.o. G5P0040 at [redacted]w[redacted]d being seen today for ongoing prenatal care.  She is currently monitored for the following issues for this high-risk pregnancy and has Supervision of high risk pregnancy, antepartum; History of multiple miscarriages; History of nephrectomy; Nutcracker phenomenon of renal vein; Palpitations; Sinus tachycardia; and Cardiac murmur on their problem list.  Patient reports worsening hip pain over past few weeks. L > R, sometimes w/ difficulty walking.  Contractions: Not present. Vag. Bleeding: None.  Movement: Present. Denies leaking of fluid.   The following portions of the patient's history were reviewed and updated as appropriate: allergies, current medications, past family history, past medical history, past social history, past surgical history and problem list.   Objective:   Vitals:   10/18/20 0832  BP: 121/81  Pulse: 87  Weight: 158 lb 3.2 oz (71.8 kg)    Fetal Status: Fetal Heart Rate (bpm): 160   Movement: Present     General:  Alert, oriented and cooperative. Patient is in no acute distress.  Skin: Skin is warm and dry. No rash noted.   Cardiovascular: Normal heart rate noted  Respiratory: Normal respiratory effort, no problems with respiration noted  Abdomen: Soft, gravid, appropriate for gestational age.  Pain/Pressure: Present     Pelvic: Cervical exam deferred        Extremities: Normal range of motion.  Edema: None  Mental Status: Normal mood and affect. Normal behavior. Normal judgment and thought content.   Assessment and Plan:  Pregnancy: G5P0040 at [redacted]w[redacted]d 1. Supervision of high risk pregnancy, antepartum No complaints Cultures at 36wks, considering re  2. Cardiac murmur Stable, asx  3. Round ligament pain PT consult  Preterm labor symptoms and general obstetric precautions including but not limited to vaginal bleeding, contractions, leaking of fluid and fetal movement were reviewed  in detail with the patient. Please refer to After Visit Summary for other counseling recommendations.   No follow-ups on file.  Future Appointments  Date Time Provider Department Center  10/19/2020  7:15 AM [facility] NURSE [facility]  10/19/2020  7:30 AM [facility] US2 [facility]  12/02/2020  2:20 PM Revankar, Aundra Dubin, MD [facility] None    Malachy Chamber, MD Patient ID: Pincus Sanes, female   DOB: [birth year], 24 y.o.   MRN: [id number]

## 2020-10-18 NOTE — Patient Instructions (Signed)
Third Trimester of Pregnancy The third trimester is from week 28 through week 40 (months 7 through 9). The third trimester is a time when the unborn baby (fetus) is growing rapidly. At the end of the ninth month, the fetus is about 20 inches in length and weighs 6-10 pounds. Body changes during your third trimester Your body will continue to go through many changes during pregnancy. The changes vary from woman to woman. During the third trimester:  Your weight will continue to increase. You can expect to gain 25-35 pounds (11-16 kg) by the end of the pregnancy.  You may begin to get stretch marks on your hips, abdomen, and breasts.  You may urinate more often because the fetus is moving lower into your pelvis and pressing on your bladder.  You may develop or continue to have heartburn. This is caused by increased hormones that slow down muscles in the digestive tract.  You may develop or continue to have constipation because increased hormones slow digestion and cause the muscles that push waste through your intestines to relax.  You may develop hemorrhoids. These are swollen veins (varicose veins) in the rectum that can itch or be painful.  You may develop swollen, bulging veins (varicose veins) in your legs.  You may have increased body aches in the pelvis, back, or thighs. This is due to weight gain and increased hormones that are relaxing your joints.  You may have changes in your hair. These can include thickening of your hair, rapid growth, and changes in texture. Some women also have hair loss during or after pregnancy, or hair that feels dry or thin. Your hair will most likely return to normal after your baby is born.  Your breasts will continue to grow and they will continue to become tender. A yellow fluid (colostrum) may leak from your breasts. This is the first milk you are producing for your baby.  Your belly button may stick out.  You may notice more swelling in your hands,  face, or ankles.  You may have increased tingling or numbness in your hands, arms, and legs. The skin on your belly may also feel numb.  You may feel short of breath because of your expanding uterus.  You may have more problems sleeping. This can be caused by the size of your belly, increased need to urinate, and an increase in your body's metabolism.  You may notice the fetus "dropping," or moving lower in your abdomen (lightening).  You may have increased vaginal discharge.  You may notice your joints feel loose and you may have pain around your pelvic bone. What to expect at prenatal visits You will have prenatal exams every 2 weeks until week 36. Then you will have weekly prenatal exams. During a routine prenatal visit:  You will be weighed to make sure you and the baby are growing normally.  Your blood pressure will be taken.  Your abdomen will be measured to track your baby's growth.  The fetal heartbeat will be listened to.  Any test results from the previous visit will be discussed.  You may have a cervical check near your due date to see if your cervix has softened or thinned (effaced).  You will be tested for Group B streptococcus. This happens between 35 and 37 weeks. Your health care provider may ask you:  What your birth plan is.  How you are feeling.  If you are feeling the baby move.  If you have had any abnormal   symptoms, such as leaking fluid, bleeding, severe headaches, or abdominal cramping.  If you are using any tobacco products, including cigarettes, chewing tobacco, and electronic cigarettes.  If you have any questions. Other tests or screenings that may be performed during your third trimester include:  Blood tests that check for low iron levels (anemia).  Fetal testing to check the health, activity level, and growth of the fetus. Testing is done if you have certain medical conditions or if there are problems during the pregnancy.  Nonstress test  (NST). This test checks the health of your baby to make sure there are no signs of problems, such as the baby not getting enough oxygen. During this test, a belt is placed around your belly. The baby is made to move, and its heart rate is monitored during movement. What is false labor? False labor is a condition in which you feel small, irregular tightenings of the muscles in the womb (contractions) that usually go away with rest, changing position, or drinking water. These are called Braxton Hicks contractions. Contractions may last for hours, days, or even weeks before true labor sets in. If contractions come at regular intervals, become more frequent, increase in intensity, or become painful, you should see your health care provider. What are the signs of labor?  Abdominal cramps.  Regular contractions that start at 10 minutes apart and become stronger and more frequent with time.  Contractions that start on the top of the uterus and spread down to the lower abdomen and back.  Increased pelvic pressure and dull back pain.  A watery or bloody mucus discharge that comes from the vagina.  Leaking of amniotic fluid. This is also known as your "water breaking." It could be a slow trickle or a gush. Let your health care provider know if it has a color or strange odor. If you have any of these signs, call your health care provider right away, even if it is before your due date. Follow these instructions at home: Medicines  Follow your health care provider's instructions regarding medicine use. Specific medicines may be either safe or unsafe to take during pregnancy.  Take a prenatal vitamin that contains at least 600 micrograms (mcg) of folic acid.  If you develop constipation, try taking a stool softener if your health care provider approves. Eating and drinking   Eat a balanced diet that includes fresh fruits and vegetables, whole grains, good sources of protein such as meat, eggs, or tofu,  and low-fat dairy. Your health care provider will help you determine the amount of weight gain that is right for you.  Avoid raw meat and uncooked cheese. These carry germs that can cause birth defects in the baby.  If you have low calcium intake from food, talk to your health care provider about whether you should take a daily calcium supplement.  Eat four or five small meals rather than three large meals a day.  Limit foods that are high in fat and processed sugars, such as fried and sweet foods.  To prevent constipation: ? Drink enough fluid to keep your urine clear or pale yellow. ? Eat foods that are high in fiber, such as fresh fruits and vegetables, whole grains, and beans. Activity  Exercise only as directed by your health care provider. Most women can continue their usual exercise routine during pregnancy. Try to exercise for 30 minutes at least 5 days a week. Stop exercising if you experience uterine contractions.  Avoid heavy lifting.  Do   not exercise in extreme heat or humidity, or at high altitudes.  Wear low-heel, comfortable shoes.  Practice good posture.  You may continue to have sex unless your health care provider tells you otherwise. Relieving pain and discomfort  Take frequent breaks and rest with your legs elevated if you have leg cramps or low back pain.  Take warm sitz baths to soothe any pain or discomfort caused by hemorrhoids. Use hemorrhoid cream if your health care provider approves.  Wear a good support bra to prevent discomfort from breast tenderness.  If you develop varicose veins: ? Wear support pantyhose or compression stockings as told by your healthcare provider. ? Elevate your feet for 15 minutes, 3-4 times a day. Prenatal care  Write down your questions. Take them to your prenatal visits.  Keep all your prenatal visits as told by your health care provider. This is important. Safety  Wear your seat belt at all times when driving.  Make  a list of emergency phone numbers, including numbers for family, friends, the hospital, and police and fire departments. General instructions  Avoid cat litter boxes and soil used by cats. These carry germs that can cause birth defects in the baby. If you have a cat, ask someone to clean the litter box for you.  Do not travel far distances unless it is absolutely necessary and only with the approval of your health care provider.  Do not use hot tubs, steam rooms, or saunas.  Do not drink alcohol.  Do not use any products that contain nicotine or tobacco, such as cigarettes and e-cigarettes. If you need help quitting, ask your health care provider.  Do not use any medicinal herbs or unprescribed drugs. These chemicals affect the formation and growth of the baby.  Do not douche or use tampons or scented sanitary pads.  Do not cross your legs for long periods of time.  To prepare for the arrival of your baby: ? Take prenatal classes to understand, practice, and ask questions about labor and delivery. ? Make a trial run to the hospital. ? Visit the hospital and tour the maternity area. ? Arrange for maternity or paternity leave through employers. ? Arrange for family and friends to take care of pets while you are in the hospital. ? Purchase a rear-facing car seat and make sure you know how to install it in your car. ? Pack your hospital bag. ? Prepare the baby's nursery. Make sure to remove all pillows and stuffed animals from the baby's crib to prevent suffocation.  Visit your dentist if you have not gone during your pregnancy. Use a soft toothbrush to brush your teeth and be gentle when you floss. Contact a health care provider if:  You are unsure if you are in labor or if your water has broken.  You become dizzy.  You have mild pelvic cramps, pelvic pressure, or nagging pain in your abdominal area.  You have lower back pain.  You have persistent nausea, vomiting, or  diarrhea.  You have an unusual or bad smelling vaginal discharge.  You have pain when you urinate. Get help right away if:  Your water breaks before 37 weeks.  You have regular contractions less than 5 minutes apart before 37 weeks.  You have a fever.  You are leaking fluid from your vagina.  You have spotting or bleeding from your vagina.  You have severe abdominal pain or cramping.  You have rapid weight loss or weight gain.  You have   shortness of breath with chest pain.  You notice sudden or extreme swelling of your face, hands, ankles, feet, or legs.  Your baby makes fewer than 10 movements in 2 hours.  You have severe headaches that do not go away when you take medicine.  You have vision changes. Summary  The third trimester is from week 28 through week 40, months 7 through 9. The third trimester is a time when the unborn baby (fetus) is growing rapidly.  During the third trimester, your discomfort may increase as you and your baby continue to gain weight. You may have abdominal, leg, and back pain, sleeping problems, and an increased need to urinate.  During the third trimester your breasts will keep growing and they will continue to become tender. A yellow fluid (colostrum) may leak from your breasts. This is the first milk you are producing for your baby.  False labor is a condition in which you feel small, irregular tightenings of the muscles in the womb (contractions) that eventually go away. These are called Braxton Hicks contractions. Contractions may last for hours, days, or even weeks before true labor sets in.  Signs of labor can include: abdominal cramps; regular contractions that start at 10 minutes apart and become stronger and more frequent with time; watery or bloody mucus discharge that comes from the vagina; increased pelvic pressure and dull back pain; and leaking of amniotic fluid. This information is not intended to replace advice given to you by your  health care provider. Make sure you discuss any questions you have with your health care provider. Document Revised: 02/06/2019 Document Reviewed: 11/21/2016 Elsevier Patient Education  2020 [employer]  

## 2020-10-19 ENCOUNTER — Ambulatory Visit: Payer: 59 | Attending: Obstetrics and Gynecology

## 2020-10-19 ENCOUNTER — Ambulatory Visit: Payer: 59 | Admitting: *Deleted

## 2020-10-19 ENCOUNTER — Encounter: Payer: Self-pay | Admitting: *Deleted

## 2020-10-19 DIAGNOSIS — O0990 Supervision of high risk pregnancy, unspecified, unspecified trimester: Secondary | ICD-10-CM

## 2020-10-19 DIAGNOSIS — Z362 Encounter for other antenatal screening follow-up: Secondary | ICD-10-CM

## 2020-10-19 DIAGNOSIS — O2623 Pregnancy care for patient with recurrent pregnancy loss, third trimester: Secondary | ICD-10-CM

## 2020-10-19 DIAGNOSIS — Z3A33 33 weeks gestation of pregnancy: Secondary | ICD-10-CM

## 2020-10-30 NOTE — L&D Delivery Note (Addendum)
Delivery Note Called to bedside and patient complete and pushing At 6:17 PM a viable female was delivered via Vaginal, Spontaneous (Presentation: Left Occiput Anterior). No nuchal cord present. R compound hand. APGAR: 9, 9; weight: pending. Placenta status: Spontaneous, Intact.  Cord: 3 vessels with the following complications: None.   Placenta sent to pathology given calcifications and COVID+.  Anesthesia: Epidural Episiotomy: None Lacerations: Right Sulcus, left labial Suture Repair: 3.0 vicryl Est. Blood Loss (mL):  225 mL  Mom to postpartum.  Baby to Couplet care / Skin to Skin.  Cora Collum 11/24/2020, 6:43 PM  GME ATTESTATION:  I saw and evaluated the patient. I agree with the findings and the plan of care as documented in the resident's note.  Alric Seton, MD OB Fellow, Faculty [facility], Center [facility] 11/24/2020 6:50 PM

## 2020-11-01 ENCOUNTER — Telehealth (INDEPENDENT_AMBULATORY_CARE_PROVIDER_SITE_OTHER): Payer: 59 | Admitting: Family Medicine

## 2020-11-01 ENCOUNTER — Encounter: Payer: Self-pay | Admitting: Family Medicine

## 2020-11-01 DIAGNOSIS — O0990 Supervision of high risk pregnancy, unspecified, unspecified trimester: Secondary | ICD-10-CM

## 2020-11-01 DIAGNOSIS — Z3A35 35 weeks gestation of pregnancy: Secondary | ICD-10-CM

## 2020-11-01 NOTE — Progress Notes (Signed)
I connected with  Pincus Sanes on 11/01/20 at 1027 by telephone and verified that I am speaking with the correct person using two identifiers.   I discussed the limitations, risks, security and privacy concerns of performing an evaluation and management service by telephone and the availability of in person appointments. I also discussed with the patient that there may be a patient responsible charge related to this service. The patient expressed understanding and agreed to proceed.  Marjo Bicker, RN 11/01/2020  10:21 AM

## 2020-11-01 NOTE — Patient Instructions (Signed)
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
## 2020-11-08 ENCOUNTER — Other Ambulatory Visit (HOSPITAL_COMMUNITY)
Admission: RE | Admit: 2020-11-08 | Discharge: 2020-11-08 | Disposition: A | Discharge status: Home / Self Care | Payer: 59 | Source: Ambulatory Visit | Attending: Obstetrics & Gynecology | Admitting: Obstetrics & Gynecology

## 2020-11-08 ENCOUNTER — Ambulatory Visit (INDEPENDENT_AMBULATORY_CARE_PROVIDER_SITE_OTHER): Payer: 59 | Admitting: Obstetrics & Gynecology

## 2020-11-08 ENCOUNTER — Other Ambulatory Visit: Payer: Self-pay

## 2020-11-08 VITALS — BP 122/83 | HR 90 | Wt 161.0 lb

## 2020-11-08 DIAGNOSIS — O0990 Supervision of high risk pregnancy, unspecified, unspecified trimester: Secondary | ICD-10-CM

## 2020-11-08 DIAGNOSIS — Z905 Acquired absence of kidney: Secondary | ICD-10-CM | POA: Diagnosis not present

## 2020-11-08 NOTE — Patient Instructions (Signed)

## 2020-11-08 NOTE — Progress Notes (Signed)
   PRENATAL VISIT NOTE  Subjective:  Barbara Reed is a 25 y.o. G5P0040 at [redacted]w[redacted]d being seen today for ongoing prenatal care.  She is currently monitored for the following issues for this low-risk pregnancy and has Supervision of high risk pregnancy, antepartum; History of multiple miscarriages; History of nephrectomy; Nutcracker phenomenon of renal vein; Palpitations; Sinus tachycardia; and Cardiac murmur on their problem list.  Patient reports occasional contractions.  Contractions: Not present. Vag. Bleeding: None.  Movement: Present. Denies leaking of fluid.   The following portions of the patient's history were reviewed and updated as appropriate: allergies, current medications, past family history, past medical history, past social history, past surgical history and problem list.   Objective:   Vitals:   11/08/20 1349  BP: 122/83  Pulse: 90  Weight: 161 lb (73 kg)    Fetal Status: Fetal Heart Rate (bpm): 142 Fundal Height: 33 cm Movement: Present  Presentation: Vertex  General:  Alert, oriented and cooperative. Patient is in no acute distress.  Skin: Skin is warm and dry. No rash noted.   Cardiovascular: Normal heart rate noted  Respiratory: Normal respiratory effort, no problems with respiration noted  Abdomen: Soft, gravid, appropriate for gestational age.  Pain/Pressure: Present     Pelvic: Cervical exam performed in the presence of a chaperone     Station: -2  Extremities: Normal range of motion.  Edema: None  Mental Status: Normal mood and affect. Normal behavior. Normal judgment and thought content.   Assessment and Plan:  Pregnancy: G5P0040 at [redacted]w[redacted]d 1. Supervision of high risk pregnancy, antepartum Routine 36 week, follow FH - GC/Chlamydia probe amp ([place])not at [facility] - Culture, beta strep (group b only)  2. History of nephrectomy   Preterm labor symptoms and general obstetric precautions including but not limited to vaginal bleeding, contractions, leaking  of fluid and fetal movement were reviewed in detail with the patient. Please refer to After Visit Summary for other counseling recommendations.   No follow-ups on file.  Future Appointments  Date Time Provider Department Center  11/15/2020  2:55 PM Adam Phenix, MD [facility]  11/24/2020  1:55 PM Noralee Chars [facility]  12/02/2020  2:20 PM Revankar, Aundra Dubin, MD [facility] None    Scheryl Darter, MD

## 2020-11-11 LAB — GC/CHLAMYDIA PROBE AMP (~~LOC~~) NOT AT ARMC
Chlamydia: NEGATIVE
Comment: NEGATIVE
Comment: NORMAL
Neisseria Gonorrhea: NEGATIVE

## 2020-11-12 LAB — CULTURE, BETA STREP (GROUP B ONLY): Strep Gp B Culture: NEGATIVE

## 2020-11-15 ENCOUNTER — Encounter: Payer: 59 | Admitting: Obstetrics & Gynecology

## 2020-11-24 ENCOUNTER — Encounter (HOSPITAL_COMMUNITY): Payer: Self-pay | Admitting: Obstetrics and Gynecology

## 2020-11-24 ENCOUNTER — Inpatient Hospital Stay (HOSPITAL_COMMUNITY): Payer: 59 | Admitting: Anesthesiology

## 2020-11-24 ENCOUNTER — Inpatient Hospital Stay (EMERGENCY_DEPARTMENT_HOSPITAL)
Admission: AD | Admit: 2020-11-24 | Discharge: 2020-11-24 | Disposition: A | Discharge status: Home / Self Care | Payer: 59 | Source: Home / Self Care | Attending: Obstetrics and Gynecology | Admitting: Obstetrics and Gynecology

## 2020-11-24 ENCOUNTER — Encounter (HOSPITAL_COMMUNITY): Payer: Self-pay | Admitting: Obstetrics & Gynecology

## 2020-11-24 ENCOUNTER — Other Ambulatory Visit: Payer: Self-pay

## 2020-11-24 ENCOUNTER — Telehealth: Payer: 59 | Admitting: Family Medicine

## 2020-11-24 ENCOUNTER — Inpatient Hospital Stay (HOSPITAL_COMMUNITY)
Admission: AD | Admit: 2020-11-24 | Discharge: 2020-11-26 | DRG: 805 | Disposition: A | Discharge status: Home / Self Care | Payer: 59 | Attending: Obstetrics and Gynecology | Admitting: Obstetrics and Gynecology

## 2020-11-24 DIAGNOSIS — R011 Cardiac murmur, unspecified: Secondary | ICD-10-CM | POA: Diagnosis present

## 2020-11-24 DIAGNOSIS — O99284 Endocrine, nutritional and metabolic diseases complicating childbirth: Secondary | ICD-10-CM | POA: Diagnosis present

## 2020-11-24 DIAGNOSIS — O9852 Other viral diseases complicating childbirth: Secondary | ICD-10-CM | POA: Diagnosis present

## 2020-11-24 DIAGNOSIS — R000 Tachycardia, unspecified: Secondary | ICD-10-CM | POA: Diagnosis present

## 2020-11-24 DIAGNOSIS — Z3A38 38 weeks gestation of pregnancy: Secondary | ICD-10-CM

## 2020-11-24 DIAGNOSIS — O26893 Other specified pregnancy related conditions, third trimester: Secondary | ICD-10-CM | POA: Diagnosis present

## 2020-11-24 DIAGNOSIS — I871 Compression of vein: Secondary | ICD-10-CM | POA: Diagnosis present

## 2020-11-24 DIAGNOSIS — O471 False labor at or after 37 completed weeks of gestation: Secondary | ICD-10-CM | POA: Insufficient documentation

## 2020-11-24 DIAGNOSIS — E876 Hypokalemia: Secondary | ICD-10-CM | POA: Diagnosis present

## 2020-11-24 DIAGNOSIS — R519 Headache, unspecified: Secondary | ICD-10-CM | POA: Diagnosis not present

## 2020-11-24 DIAGNOSIS — U071 COVID-19: Secondary | ICD-10-CM

## 2020-11-24 DIAGNOSIS — O0990 Supervision of high risk pregnancy, unspecified, unspecified trimester: Secondary | ICD-10-CM

## 2020-11-24 DIAGNOSIS — O479 False labor, unspecified: Secondary | ICD-10-CM

## 2020-11-24 DIAGNOSIS — N96 Recurrent pregnancy loss: Secondary | ICD-10-CM | POA: Diagnosis present

## 2020-11-24 DIAGNOSIS — O99893 Other specified diseases and conditions complicating puerperium: Secondary | ICD-10-CM | POA: Diagnosis not present

## 2020-11-24 DIAGNOSIS — R002 Palpitations: Secondary | ICD-10-CM | POA: Diagnosis present

## 2020-11-24 DIAGNOSIS — R030 Elevated blood-pressure reading, without diagnosis of hypertension: Secondary | ICD-10-CM | POA: Diagnosis present

## 2020-11-24 DIAGNOSIS — Z905 Acquired absence of kidney: Secondary | ICD-10-CM

## 2020-11-24 LAB — CBC
HCT: 36.4 % (ref 36.0–46.0)
Hemoglobin: 12.5 g/dL (ref 12.0–15.0)
MCH: 31 pg (ref 26.0–34.0)
MCHC: 34.3 g/dL (ref 30.0–36.0)
MCV: 90.3 fL (ref 80.0–100.0)
Platelets: 298 10*3/uL (ref 150–400)
RBC: 4.03 MIL/uL (ref 3.87–5.11)
RDW: 13.1 % (ref 11.5–15.5)
WBC: 14.4 10*3/uL — ABNORMAL HIGH (ref 4.0–10.5)
nRBC: 0 % (ref 0.0–0.2)

## 2020-11-24 LAB — COMPREHENSIVE METABOLIC PANEL
ALT: 38 U/L (ref 0–44)
AST: 30 U/L (ref 15–41)
Albumin: 2.8 g/dL — ABNORMAL LOW (ref 3.5–5.0)
Alkaline Phosphatase: 118 U/L (ref 38–126)
Anion gap: 13 (ref 5–15)
BUN: 5 mg/dL — ABNORMAL LOW (ref 6–20)
CO2: 18 mmol/L — ABNORMAL LOW (ref 22–32)
Calcium: 8.2 mg/dL — ABNORMAL LOW (ref 8.9–10.3)
Chloride: 107 mmol/L (ref 98–111)
Creatinine, Ser: 0.59 mg/dL (ref 0.44–1.00)
GFR, Estimated: >60 mL/min (ref >60)
Glucose, Bld: 86 mg/dL (ref 70–99)
Potassium: 2.8 mmol/L — ABNORMAL LOW (ref 3.5–5.1)
Sodium: 138 mmol/L (ref 135–145)
Total Bilirubin: 0.8 mg/dL (ref 0.3–1.2)
Total Protein: 5.8 g/dL — ABNORMAL LOW (ref 6.5–8.1)

## 2020-11-24 LAB — SARS CORONAVIRUS 2 BY RT PCR (HOSPITAL ORDER, PERFORMED IN ~~LOC~~ HOSPITAL LAB): SARS Coronavirus 2: POSITIVE — AB

## 2020-11-24 LAB — MAGNESIUM: Magnesium: 1.5 mg/dL — ABNORMAL LOW (ref 1.7–2.4)

## 2020-11-24 LAB — TYPE AND SCREEN
ABO/RH(D): A POS
Antibody Screen: NEGATIVE

## 2020-11-24 LAB — POCT FERN TEST: POCT Fern Test: POSITIVE

## 2020-11-24 MED ORDER — ONDANSETRON HCL 4 MG/2ML IJ SOLN: 4.0000 mg | INTRAMUSCULAR | Status: DC | PRN

## 2020-11-24 MED ORDER — MAGNESIUM OXIDE 400 (241.3 MG) MG PO TABS
400.0000 mg | ORAL_TABLET | ORAL | Status: AC
  Administered 2020-11-25 (×3): 400 mg via ORAL
  Filled 2020-11-24 (×3): qty 1

## 2020-11-24 MED ORDER — MISOPROSTOL 200 MCG PO TABS
600.0000 ug | ORAL_TABLET | Freq: Once | ORAL | Status: AC
  Administered 2020-11-24: 600 ug via RECTAL

## 2020-11-24 MED ORDER — IBUPROFEN 600 MG PO TABS
600.0000 mg | ORAL_TABLET | Freq: Four times a day (QID) | ORAL | Status: DC
  Administered 2020-11-25 – 2020-11-26 (×4): 600 mg via ORAL
  Filled 2020-11-24 (×5): qty 1

## 2020-11-24 MED ORDER — DIBUCAINE (PERIANAL) 1 % EX OINT: 1.0000 "application " | TOPICAL_OINTMENT | CUTANEOUS | Status: DC | PRN

## 2020-11-24 MED ORDER — OXYCODONE-ACETAMINOPHEN 5-325 MG PO TABS: 2.0000 | ORAL_TABLET | ORAL | Status: DC | PRN

## 2020-11-24 MED ORDER — MISOPROSTOL 200 MCG PO TABS
ORAL_TABLET | ORAL | Status: AC
  Filled 2020-11-24: qty 3

## 2020-11-24 MED ORDER — DIPHENHYDRAMINE HCL 50 MG/ML IJ SOLN: 12.5000 mg | INTRAMUSCULAR | Status: DC | PRN

## 2020-11-24 MED ORDER — ACETAMINOPHEN 325 MG PO TABS: 650.0000 mg | ORAL_TABLET | ORAL | Status: DC | PRN

## 2020-11-24 MED ORDER — LACTATED RINGERS IV SOLN: 500.0000 mL | Freq: Once | INTRAVENOUS | Status: DC

## 2020-11-24 MED ORDER — BENZOCAINE-MENTHOL 20-0.5 % EX AERO
1.0000 | INHALATION_SPRAY | CUTANEOUS | Status: DC | PRN
Start: 2020-11-24 — End: 2020-11-26
  Administered 2020-11-25: 1 via TOPICAL
  Filled 2020-11-24: qty 56

## 2020-11-24 MED ORDER — WITCH HAZEL-GLYCERIN EX PADS: 1.0000 "application " | MEDICATED_PAD | CUTANEOUS | Status: DC | PRN

## 2020-11-24 MED ORDER — OXYCODONE HCL 5 MG PO TABS
5.0000 mg | ORAL_TABLET | ORAL | Status: DC | PRN
  Administered 2020-11-25: 5 mg via ORAL
  Filled 2020-11-24: qty 1

## 2020-11-24 MED ORDER — POTASSIUM CITRATE ER 10 MEQ (1080 MG) PO TBCR
20.0000 meq | EXTENDED_RELEASE_TABLET | ORAL | Status: AC
  Administered 2020-11-25 (×3): 20 meq via ORAL
  Filled 2020-11-24 (×4): qty 2

## 2020-11-24 MED ORDER — PHENYLEPHRINE 40 MCG/ML (10ML) SYRINGE FOR IV PUSH (FOR BLOOD PRESSURE SUPPORT): 80.0000 ug | PREFILLED_SYRINGE | INTRAVENOUS | Status: DC | PRN

## 2020-11-24 MED ORDER — ONDANSETRON HCL 4 MG/2ML IJ SOLN: 4.0000 mg | Freq: Four times a day (QID) | INTRAMUSCULAR | Status: DC | PRN

## 2020-11-24 MED ORDER — OXYCODONE-ACETAMINOPHEN 5-325 MG PO TABS: 1.0000 | ORAL_TABLET | ORAL | Status: DC | PRN

## 2020-11-24 MED ORDER — EPHEDRINE 5 MG/ML INJ: 10.0000 mg | INTRAVENOUS | Status: DC | PRN

## 2020-11-24 MED ORDER — TETANUS-DIPHTH-ACELL PERTUSSIS 5-2.5-18.5 LF-MCG/0.5 IM SUSY: 0.5000 mL | PREFILLED_SYRINGE | Freq: Once | INTRAMUSCULAR | Status: DC

## 2020-11-24 MED ORDER — OXYTOCIN-SODIUM CHLORIDE 30-0.9 UT/500ML-% IV SOLN
2.5000 [IU]/h | INTRAVENOUS | Status: DC
  Filled 2020-11-24 (×2): qty 500

## 2020-11-24 MED ORDER — LACTATED RINGERS IV SOLN: INTRAVENOUS | Status: DC

## 2020-11-24 MED ORDER — PRENATAL MULTIVITAMIN CH
1.0000 | ORAL_TABLET | Freq: Every day | ORAL | Status: DC
  Administered 2020-11-25 – 2020-11-26 (×2): 1 via ORAL
  Filled 2020-11-24 (×2): qty 1

## 2020-11-24 MED ORDER — LIDOCAINE HCL (PF) 1 % IJ SOLN
INTRAMUSCULAR | Status: DC | PRN
  Administered 2020-11-24: 8 mL via EPIDURAL

## 2020-11-24 MED ORDER — OXYTOCIN BOLUS FROM INFUSION
333.0000 mL | Freq: Once | INTRAVENOUS | Status: AC
  Administered 2020-11-24: 333 mL via INTRAVENOUS

## 2020-11-24 MED ORDER — FENTANYL-BUPIVACAINE-NACL 0.5-0.125-0.9 MG/250ML-% EP SOLN
12.0000 mL/h | EPIDURAL | Status: DC | PRN
  Administered 2020-11-24: 12 mL/h via EPIDURAL
  Filled 2020-11-24: qty 250

## 2020-11-24 MED ORDER — MEASLES, MUMPS & RUBELLA VAC IJ SOLR: 0.5000 mL | Freq: Once | INTRAMUSCULAR | Status: DC

## 2020-11-24 MED ORDER — LACTATED RINGERS IV SOLN: 500.0000 mL | INTRAVENOUS | Status: DC | PRN

## 2020-11-24 MED ORDER — SIMETHICONE 80 MG PO CHEW: 80.0000 mg | CHEWABLE_TABLET | ORAL | Status: DC | PRN

## 2020-11-24 MED ORDER — ONDANSETRON HCL 4 MG PO TABS: 4.0000 mg | ORAL_TABLET | ORAL | Status: DC | PRN

## 2020-11-24 MED ORDER — SOD CITRATE-CITRIC ACID 500-334 MG/5ML PO SOLN: 30.0000 mL | ORAL | Status: DC | PRN

## 2020-11-24 MED ORDER — LIDOCAINE HCL (PF) 1 % IJ SOLN: 30.0000 mL | INTRAMUSCULAR | Status: DC | PRN

## 2020-11-24 MED ORDER — DIPHENHYDRAMINE HCL 25 MG PO CAPS
25.0000 mg | ORAL_CAPSULE | Freq: Four times a day (QID) | ORAL | Status: DC | PRN
Start: 2020-11-24 — End: 2020-11-26

## 2020-11-24 MED ORDER — COCONUT OIL OIL
1.0000 "application " | TOPICAL_OIL | Status: DC | PRN
  Administered 2020-11-26: 1 via TOPICAL

## 2020-11-24 MED ORDER — ACETAMINOPHEN 325 MG PO TABS
650.0000 mg | ORAL_TABLET | ORAL | Status: DC | PRN
  Administered 2020-11-25 – 2020-11-26 (×3): 650 mg via ORAL
  Filled 2020-11-24 (×3): qty 2

## 2020-11-24 MED ORDER — SENNOSIDES-DOCUSATE SODIUM 8.6-50 MG PO TABS
2.0000 | ORAL_TABLET | ORAL | Status: DC
  Administered 2020-11-25 – 2020-11-26 (×2): 2 via ORAL
  Filled 2020-11-24 (×2): qty 2

## 2020-11-24 NOTE — MAU Note (Signed)
Presents with c/o ctxs and LOF.  Reports has been having ctxs since 1245 this morning.  States was sent home from MAU earlier this morning and cervix was approximately 5 cms.  States notices LOF approx 10 minutes ago, 0920.  Endorses +FM, not much in last hour.

## 2020-11-24 NOTE — Discharge Instructions (Signed)
Rosen's Emergency Medicine: Concepts and Clinical Practice (9th ed., pp. 2296- 2312). Elsevier.">  Braxton Hicks Contractions Contractions of the uterus can occur throughout pregnancy, but they are not always a sign that you are in labor. You may have practice contractions called Braxton Hicks contractions. These false labor contractions are sometimes confused with true labor. What are Braxton Hicks contractions? Braxton Hicks contractions are tightening movements that occur in the muscles of the uterus before labor. Unlike true labor contractions, these contractions do not result in opening (dilation) and thinning of the cervix. Toward the end of pregnancy (32-34 weeks), Braxton Hicks contractions can happen more often and may become stronger. These contractions are sometimes difficult to tell apart from true labor because they can be very uncomfortable. You should not feel embarrassed if you go to the hospital with false labor. Sometimes, the only way to tell if you are in true labor is for your health care provider to look for changes in the cervix. The health care provider will do a physical exam and may monitor your contractions. If you are not in true labor, the exam should show that your cervix is not dilating and your water has not broken. If there are no other health problems associated with your pregnancy, it is completely safe for you to be sent home with false labor. You may continue to have Braxton Hicks contractions until you go into true labor. How to tell the difference between true labor and false labor True labor  Contractions last 30-70 seconds.  Contractions become very regular.  Discomfort is usually felt in the top of the uterus, and it spreads to the lower abdomen and low back.  Contractions do not go away with walking.  Contractions usually become more intense and increase in frequency.  The cervix dilates and gets thinner. False labor  Contractions are usually shorter  and not as strong as true labor contractions.  Contractions are usually irregular.  Contractions are often felt in the front of the lower abdomen and in the groin.  Contractions may go away when you walk around or change positions while lying down.  Contractions get weaker and are shorter-lasting as time goes on.  The cervix usually does not dilate or become thin. Follow these instructions at home:  Take over-the-counter and prescription medicines only as told by your health care provider.  Keep up with your usual exercises and follow other instructions from your health care provider.  Eat and drink lightly if you think you are going into labor.  If Braxton Hicks contractions are making you uncomfortable: ? Change your position from lying down or resting to walking, or change from walking to resting. ? Sit and rest in a tub of warm water. ? Drink enough fluid to keep your urine pale yellow. Dehydration may cause these contractions. ? Do slow and deep breathing several times an hour.  Keep all follow-up prenatal visits as told by your health care provider. This is important.   Contact a health care provider if:  You have a fever.  You have continuous pain in your abdomen. Get help right away if:  Your contractions become stronger, more regular, and closer together.  You have fluid leaking or gushing from your vagina.  You pass blood-tinged mucus (bloody show).  You have bleeding from your vagina.  You have low back pain that you never had before.  You feel your baby's head pushing down and causing pelvic pressure.  Your baby is not moving inside   you as much as it used to. Summary  Contractions that occur before labor are called Braxton Hicks contractions, false labor, or practice contractions.  Braxton Hicks contractions are usually shorter, weaker, farther apart, and less regular than true labor contractions. True labor contractions usually become progressively  stronger and regular, and they become more frequent.  Manage discomfort from Braxton Hicks contractions by changing position, resting in a warm bath, drinking plenty of water, or practicing deep breathing. This information is not intended to replace advice given to you by your health care provider. Make sure you discuss any questions you have with your health care provider. Document Revised: 09/28/2017 Document Reviewed: 03/01/2017 Elsevier Patient Education  2021 Elsevier Inc.   Fetal Movement Counts Patient Name: ________________________________________________ Patient Due Date: ____________________  What is a fetal movement count? A fetal movement count is the number of times that you feel your baby move during a certain amount of time. This may also be called a fetal kick count. A fetal movement count is recommended for every pregnant woman. You may be asked to start counting fetal movements as early as week 28 of your pregnancy. Pay attention to when your baby is most active. You may notice your baby's sleep and wake cycles. You may also notice things that make your baby move more. You should do a fetal movement count:  When your baby is normally most active.  At the same time each day. A good time to count movements is while you are resting, after having something to eat and drink. How do I count fetal movements? 1. Find a quiet, comfortable area. Sit, or lie down on your side. 2. Write down the date, the start time and stop time, and the number of movements that you felt between those two times. Take this information with you to your health care visits. 3. Write down your start time when you feel the first movement. 4. Count kicks, flutters, swishes, rolls, and jabs. You should feel at least 10 movements. 5. You may stop counting after you have felt 10 movements, or if you have been counting for 2 hours. Write down the stop time. 6. If you do not feel 10 movements in 2 hours, contact  your health care provider for further instructions. Your health care provider may want to do additional tests to assess your baby's well-being. Contact a health care provider if:  You feel fewer than 10 movements in 2 hours.  Your baby is not moving like he or she usually does. Date: ____________ Start time: ____________ Stop time: ____________ Movements: ____________ Date: ____________ Start time: ____________ Stop time: ____________ Movements: ____________ Date: ____________ Start time: ____________ Stop time: ____________ Movements: ____________ Date: ____________ Start time: ____________ Stop time: ____________ Movements: ____________ Date: ____________ Start time: ____________ Stop time: ____________ Movements: ____________ Date: ____________ Start time: ____________ Stop time: ____________ Movements: ____________ Date: ____________ Start time: ____________ Stop time: ____________ Movements: ____________ Date: ____________ Start time: ____________ Stop time: ____________ Movements: ____________ Date: ____________ Start time: ____________ Stop time: ____________ Movements: ____________ This information is not intended to replace advice given to you by your health care provider. Make sure you discuss any questions you have with your health care provider. Document Revised: 06/05/2019 Document Reviewed: 06/05/2019 Elsevier Patient Education  2021 Elsevier Inc.  

## 2020-11-24 NOTE — Lactation Note (Signed)
Lactation Consultation Note  Patient Name: Barbara Reed CHJSC'B Date: 11/24/2020   Age:25 y.o.  Initial Labor and Delivery Consult:  Mother is Covid +.  Initial visit < 1 hour of life  Baby was awake STS on mother's chest when I arrived.  Offered to assist with latching and mother agreeable.  Assisted to latch to the left breast after a couple of attempts.  Baby took a few sucks but then became fussy.  Calmed her and tried again with the same results.  Reassured mother that a Advertising copywriter will be available to help as needed on the Mother/Baby unit.  Placed baby STS and she became quiet; covered with a warm blanket.  Father present.   Maternal Data    Feeding    LATCH Score                   Interventions    Lactation Tools Discussed/Used     Consult Status Consult Status: Follow-up Date: 11/25/20 Follow-up type: In-patient    Barbara Reed R [name] 11/24/2020, 7:09 PM

## 2020-11-24 NOTE — Discharge Instructions (Signed)

## 2020-11-24 NOTE — Progress Notes (Signed)
During assessment, patient rated pain a 7 on scale of 0-10. Patient had order for Oxycodone with tylenol 2 tablets PO for pain greater than or equal to 7. The order discontinued at 2200 and I was unable to come of the room in time. Notified teaching service physician and she stated that she would put in the order for the PRN Oxy with tylenol.

## 2020-11-24 NOTE — Anesthesia Preprocedure Evaluation (Signed)
Anesthesia Evaluation  Patient identified by MRN, date of birth, ID band Patient awake    Reviewed: Allergy & Precautions, H&P , NPO status , Patient's Chart, lab work & pertinent test results  Airway Mallampati: I       Dental no notable dental hx.    Pulmonary neg pulmonary ROS,    Pulmonary exam normal        Cardiovascular negative cardio ROS Normal cardiovascular exam     Neuro/Psych negative neurological ROS  negative psych ROS   GI/Hepatic negative GI ROS, Neg liver ROS,   Endo/Other  negative endocrine ROS  Renal/GU negative Renal ROS     Musculoskeletal negative musculoskeletal ROS (+)   Abdominal Normal abdominal exam  (+)   Peds  Hematology negative hematology ROS (+)   Anesthesia Other Findings   Reproductive/Obstetrics (+) Pregnancy                             Anesthesia Physical Anesthesia Plan  ASA: II  Anesthesia Plan: Epidural   Post-op Pain Management:    Induction:   PONV Risk Score and Plan:   Airway Management Planned:   Additional Equipment:   Intra-op Plan:   Post-operative Plan:   Informed Consent: I have reviewed the patients History and Physical, chart, labs and discussed the procedure including the risks, benefits and alternatives for the proposed anesthesia with the patient or authorized representative who has indicated his/her understanding and acceptance.       Plan Discussed with:   Anesthesia Plan Comments:         Anesthesia Quick Evaluation

## 2020-11-24 NOTE — H&P (Addendum)
OBSTETRIC ADMISSION HISTORY AND PHYSICAL  Barbara Reed is a 25 y.o. female G5P0040 with IUP at [redacted]w[redacted]d by [place] @7w  presenting for SROM at 9:20 am with worsening contractions. She reports having bilateral frontal headache for the past month. She has not taken any meds for this. She has had mildly elevated BP readings in clinic, none greater than 140/90. She reports +FMs, no VB, no blurry vision or peripheral edema, and RUQ pain.  She plans on breast feeding. She declines birth control stating preference for condoms. She received her prenatal care at [facility].  Dating: By [facility] @[redacted]w[redacted]d  --->  Estimated Date of Delivery: 12/05/20  Sono:  10/19/20@[redacted]w[redacted]d , CWD, normal anatomy, cephalic presentation, 2129 g, 37 % EFW  Prenatal History/Complications:  -- Nutcracker Phenomenon: s/p left nephrectomy -- Palpitations: per cardiology "sinus tachycardia. Symptoms suggestive of a possible benign etiology"   - 2-week monitoring WNL   - no medications initiated -- Cardiac Murmur: c/s cardiology- echo ordered, not completed -- Multiple miscarriages: 4 previous SAB, most recently 2021  Past Medical History: Past Medical History:  Diagnosis Date  . 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 Declined progesterone  . 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 Nutcracker phenomenon of renal vein   . Supervision of high risk pregnancy, antepartum 07/13/2020    Nursing Staff Provider Office Location [facility] Dating   Language  English Anatomy 12   Flu Vaccine  Declined 08/10/20 Genetic Screen  NIPS:   AFP:   First Screen:  Quad:   TDaP Vaccine    Hgb A1C or  GTT Early  Third trimester  COVID Vaccine    LAB RESULTS  Rhogam  NA Blood Type A/Positive/-- (06/22 0000)  Feeding Plan Breast Antibody Negative (06/22 0000) Contraception ?? Rubella Immune (06/22 0   Past Surgical  History: Past Surgical History:  Procedure Laterality Date  . NEPHRECTOMY    . OVARIAN CYST REMOVAL    . TONSILLECTOMY     Obstetrical History: OB History    Gravida  5   Para  0   Term  0   Preterm  0   AB  4   Living  0     SAB  4   IAB  0   Ectopic  0   Multiple  0   Live Births  0          Social History Social History   Socioeconomic History  . Marital status: Married    Spouse name: Not on file  . Number of children: Not on file  . Years of education: Not on file  . Highest education level: Not on file  Occupational History  . Not on file  Tobacco Use  . Smoking status: Never Smoker  . Smokeless tobacco: Never Used  Substance and Sexual Activity  . Alcohol use: Not Currently  . Drug use: Never  . Sexual activity: Not on file  Other Topics Concern  . Not on file  Social History Narrative  . Not on file   Social Determinants of Health   Financial Resource Strain: Not on file  Food Insecurity: No Food Insecurity  . Worried About [birth year]1972 in the Last Year: Never true  . Ran Out of Food in the Last Year: Never true  Transportation Needs: No Transportation Needs  . Lack of  Transportation (Medical): No  . Lack of Transportation (Non-Medical): No  Physical Activity: Not on file  Stress: Not on file  Social Connections: Not on file   Family History: Family History  Problem Relation Age of Onset  . Cancer Maternal Grandmother   . Cancer Paternal Grandmother   . Hypertension Paternal Grandfather    Allergies: No Known Allergies  Medications Prior to Admission  Medication Sig Dispense Refill Last Dose  . pantoprazole (PROTONIX) 20 MG tablet Take 1 tablet (20 mg total) by mouth daily. 30 tablet 2   . Prenatal Vit-Fe Fumarate-FA (PRENATAL VITAMINS PO) Take by mouth.      Review of Systems  All systems reviewed and negative except as stated in HPI  Blood pressure (!) 144/88, pulse 100, temperature 97.9 F (36.6 C),  temperature source Oral, resp. rate 16, last menstrual period 02/23/2020, SpO2 99 %. General appearance: alert, appears stated age and mild distress Lungs: clear to auscultation bilaterally Heart: regular rate and rhythm, no M/R/G appreciated Abdomen: soft, gravid Extremities: Homans sign is negative, no sign of DVT Presentation: cephalic by provider exam Fetal monitoringBaseline: 140 bpm, Variability: Good {> 6 bpm), Accelerations: Reactive and Decelerations: Absent Uterine activity: q1-2 min Dilation: 6 Effacement (%): 90 Station: -1 Exam by:: Dr. Germaine Pomfret   Prenatal labs: ABO, Rh: --/--/PENDING (01/26 1000) Antibody: PENDING (01/26 1000) Rubella: Immune (06/22 0000) RPR: Non Reactive (11/09 0854)  HBsAg: Negative (06/22 0000)  HIV: Non Reactive (11/09 0854)  GBS: Negative/-- (01/10 1359)  2 hr Glucola: passed Genetic screening: Declined Anatomy US: Normal  Prenatal Transfer Tool  Maternal Diabetes: No Genetic Screening: Declined Maternal Ultrasounds/Referrals: Normal; MOB w/ one kidney on right side Fetal Ultrasounds or other Referrals:  None Maternal Substance Abuse:  No Significant Maternal Medications:  None Significant Maternal Lab Results: Group B Strep negative   Results for orders placed or performed during the hospital encounter of 11/24/20 (from the past 24 hour(s))  Type and screen [facility]   Collection Time: 11/24/20 10:00 AM  Result Value Ref Range   ABO/RH(D) PENDING    Antibody Screen PENDING    Sample Expiration      11/27/2020,2359 Performed at [facility], [address] [place], [place] [address]   Fern Test   Collection Time: 11/24/20 10:11 AM  Result Value Ref Range   POCT Fern Test Positive = ruptured amniotic membanes   CBC   Collection Time: 11/24/20 10:28 AM  Result Value Ref Range   WBC 14.4 (H) 4.0 - 10.5 K/uL   RBC 4.03 3.87 - 5.11 MIL/uL   Hemoglobin 12.5 12.0 - 15.0 g/dL   HCT 87.6 81.1 - 57.2 %    MCV 90.3 80.0 - 100.0 fL   MCH 31.0 26.0 - 34.0 pg   MCHC 34.3 30.0 - 36.0 g/dL   RDW 62.0 35.5 - 97.4 %   Platelets 298 150 - 400 K/uL   nRBC 0.0 0.0 - 0.2 %    Patient Active Problem List   Diagnosis Date Noted  . Normal labor 11/24/2020  . Palpitations 08/31/2020  . Sinus tachycardia 08/31/2020  . Cardiac murmur 08/31/2020  . Nutcracker phenomenon of renal vein   . Supervision of high risk pregnancy, antepartum 07/13/2020  . 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 [id number] at [redacted]w[redacted]d here for SOL/SROM@0930 .  #Labor: Will manage expectantly. #Pain: PRN; desires epidural #FWB: Category 1  strip #ID: GBS negative #MOF: Breast #MOC: Declined- electing for condoms  #Elevated BP: BP's of 144/88 and 150/92 since presentation. No history of cHTN or gHTN, prior SBP 130s intermittently at clinics. Currently with headache, has not had any meds. Given headache has been chronic in nature, do not suspect related to BP. Ordered pre-e lab work-up.  #Hypokalemic: incidentally noted to have K 2.8 on admission, will supplement PO postpartum.  Melvyn Neth, Medical Student  11/24/2020, 11:04 AM   GME ATTESTATION:  I saw and evaluated the patient. I agree with the findings and the plan of care as documented in the medical student's note.  Alric Seton, MD OB Fellow, Faculty [facility], Center for [facility] 11/24/2020 11:34 AM

## 2020-11-24 NOTE — Anesthesia Procedure Notes (Signed)
Epidural Patient location during procedure: OB Start time: 11/24/2020 11:29 AM End time: 11/24/2020 11:33 AM  Staffing Anesthesiologist: Leilani Able, MD Performed: anesthesiologist   Preanesthetic Checklist Completed: patient identified, IV checked, site marked, risks and benefits discussed, surgical consent, monitors and equipment checked, pre-op evaluation and timeout performed  Epidural Patient position: sitting Prep: DuraPrep and site prepped and draped Patient monitoring: continuous pulse ox and blood pressure Approach: midline Location: L3-L4 Injection technique: LOR air  Needle:  Needle type: Tuohy  Needle gauge: 17 G Needle length: 9 cm and 9 Needle insertion depth: 8 cm Catheter type: closed end flexible Catheter size: 19 Gauge Catheter at skin depth: 13 cm Test dose: negative and Other  Assessment Events: blood not aspirated, injection not painful, no injection resistance, no paresthesia and negative IV test  Additional Notes Reason for block:procedure for pain

## 2020-11-24 NOTE — Discharge Summary (Addendum)
Postpartum Discharge Summary     Patient Name: Barbara Reed DOB: 1995-11-21 MRN: [id number]  Date of admission: 11/24/2020 Delivery date:11/24/2020  Delivering provider: Arrie Senate  Date of discharge: 11/27/2020  Admitting diagnosis: Normal labor [O80, Z37.9] Intrauterine pregnancy: [redacted]w[redacted]d    Secondary diagnosis:  Active Problems:   Supervision of high risk pregnancy, antepartum   History of multiple miscarriages   History of nephrectomy   Nutcracker phenomenon of renal vein   Palpitations   Sinus tachycardia   Cardiac murmur   Vaginal delivery  Additional problems: none    Discharge diagnosis: Term Pregnancy Delivered                                              Post partum procedures:none Augmentation: N/A Complications: None  Hospital course: Onset of Labor With Vaginal Delivery      25y.o. yo G5P0040 at 352w3das admitted in Active Labor on 11/24/2020. Patient had an uncomplicated labor course as follows:  Membrane Rupture Time/Date: 9:20 AM ,11/24/2020   Delivery Method:Vaginal, Spontaneous  Episiotomy: None  Lacerations:  Sulcus  Patient had an uncomplicated postpartum course. She was found to have elevated blood pressures on postpartum unit and was started on norvasc [id number] a follow up BP check scheduled. She is ambulating, tolerating a regular diet, passing flatus, and urinating well. Patient is discharged home in stable condition on 11/27/20.  Newborn Data: Birth date:11/24/2020  Birth time:6:17 PM  Gender:Female  Living status:Living  Apgars:9 ,9  Weight:3056 g   Magnesium Sulfate received: No BMZ received: No Rhophylac:N/A MMR:N/A T-DaP:Given prenatally Flu: No Transfusion:No  Physical exam  Vitals:   11/25/20 0620 11/25/20 0915 11/25/20 1420 11/25/20 2102  BP: 106/68 117/69 121/82 99/63  Pulse: 82 73 77 88  Resp:  17 18   Temp: 98.4 F (36.9 C) 98.1 F (36.7 C) 98.7 F (37.1 C) 98.6 F (37 C)  TempSrc: Oral Oral Oral Oral   SpO2: 98% 99% 99% 96%  Weight:      Height:       General: alert, cooperative and no distress Lochia: appropriate Uterine Fundus: firm Incision: N/A DVT Evaluation: No evidence of DVT seen on physical exam. Labs: Lab Results  Component Value Date   WBC 14.4 (H) 11/24/2020   HGB 12.5 11/24/2020   HCT 36.4 11/24/2020   MCV 90.3 11/24/2020   PLT 298 11/24/2020   CMP Latest Ref Rng & Units 11/24/2020  Glucose 70 - 99 mg/dL 86  BUN 6 - 20 mg/dL 5(L)  Creatinine 0.44 - 1.00 mg/dL 0.59  Sodium 135 - 145 mmol/L 138  Potassium 3.5 - 5.1 mmol/L 2.8(L)  Chloride 98 - 111 mmol/L 107  CO2 22 - 32 mmol/L 18(L)  Calcium 8.9 - 10.3 mg/dL 8.2(L)  Total Protein 6.5 - 8.1 g/dL 5.8(L)  Total Bilirubin 0.3 - 1.2 mg/dL 0.8  Alkaline Phos 38 - 126 U/L 118  AST 15 - 41 U/L 30  ALT 0 - 44 U/L 38   Edinburgh Score: Edinburgh Postnatal Depression Scale Screening Tool 11/25/2020  I have been able to laugh and see the funny side of things. 0  I have looked forward with enjoyment to things. 0  I have blamed myself unnecessarily when things went wrong. 2  I have been anxious or worried for no good reason. 2  I  have felt scared or panicky for no good reason. 1  Things have been getting on top of me. 2  I have been so unhappy that I have had difficulty sleeping. 1  I have felt sad or miserable. 1  I have been so unhappy that I have been crying. 0  The thought of harming myself has occurred to me. 0  Edinburgh Postnatal Depression Scale Total 9     After visit meds:  Allergies as of 11/26/2020   No Known Allergies     Medication List    STOP taking these medications   pantoprazole 20 MG tablet Commonly known as: Protonix   PRENATAL VITAMINS PO     TAKE these medications   amLODipine 5 MG tablet Commonly known as: NORVASC Take 1 tablet (5 mg total) by mouth daily.   coconut oil Oil Apply 1 application topically as needed.   ibuprofen 800 MG tablet Commonly known as: ADVIL Take 1  tablet (800 mg total) by mouth every 8 (eight) hours as needed.        Discharge home in stable condition Infant Feeding: Breast Infant Disposition:home with mother Discharge instruction: per After Visit Summary and Postpartum booklet. Activity: Advance as tolerated. Pelvic rest for 6 weeks.  Diet: routine diet Future Appointments: Future Appointments  Date Time Provider [place]  12/02/2020  2:20 PM Revankar, Reita Cliche, MD [facility] None   Follow up Visit: Message sent to [facility] 11/24/20 by Sylvester Harder.   Please schedule this patient for a In person postpartum visit in 4 weeks with the following provider: Any provider. Additional Postpartum F/U:BP check 1 week, no diagnosis but elevated on admit to L&D Low risk pregnancy complicated by: n/a Delivery mode:  Vaginal, Spontaneous  Anticipated Birth Control:  Condoms   11/27/2020 Shary Key, DO   I saw and evaluated the patient. I agree with the findings and the plan of care as documented in the resident's note.  Sharene Skeans, MD [facility] Family Medicine Fellow, [facility] for [facility] [place]

## 2020-11-24 NOTE — MAU Provider Note (Signed)
S: Ms. Barbara Reed is a 25 y.o. [id number] at [redacted]w[redacted]d  who presents to MAU today for labor evaluation.    Cervical exam by RN:  Dilation: 4 Effacement (%): 80 Station: -1 Presentation: Vertex Exam by:: Manuela Neptune, RN    Fetal Monitoring: Baseline: 130 Variability: moderate Accelerations: multiple Decelerations: none Contractions: rare  MDM Discussed patient with RN. NST reviewed and reactive.  A: SIUP at [redacted]w[redacted]d. False labor--cervical exam unchanged over 3 hours with rare contractions.  P: Discharge home Labor precautions and kick counts included in AVS Patient to follow-up with Dr. Adrian Blackwater later today (11/24/20) as scheduled  Patient may return to MAU as needed or when in labor   Sheila Oats, MD 11/24/2020 6:13 AM

## 2020-11-24 NOTE — MAU Note (Signed)
..  Barbara Reed is a 25 y.o. at [redacted]w[redacted]d here in MAU reporting: contractions every 5-10 minutes since 0045. +FM. Denies vaginal bleeding or LOF.

## 2020-11-25 LAB — RPR: RPR Ser Ql: NONREACTIVE

## 2020-11-25 MED ORDER — AMLODIPINE BESYLATE 5 MG PO TABS
5.0000 mg | ORAL_TABLET | Freq: Every day | ORAL | Status: DC
  Administered 2020-11-25: 5 mg via ORAL
  Filled 2020-11-25: qty 1

## 2020-11-25 NOTE — Progress Notes (Addendum)
Post Partum Day 1 Subjective: no complaints, up ad lib, voiding, tolerating PO and + flatus  Patient had postural HA last night that resolved with 1 dose of ibuprofen  Objective: Blood pressure 106/68, pulse 82, temperature 98.4 F (36.9 C), temperature source Oral, resp. rate 16, height 5\' 5"  (1.651 m), weight 74.3 kg, last menstrual period 02/23/2020, SpO2 98 %, unknown if currently breastfeeding.  Physical Exam:  General: alert, cooperative, appears stated age and no distress Lochia: appropriate Uterine Fundus: firm Incision: n/a DVT Evaluation: No evidence of DVT seen on physical exam.  Recent Labs    11/24/20 1028  HGB 12.5  HCT 36.4    Assessment/Plan:  PPD#1 s/p SVD.  Plan for discharge tomorrow, Breastfeeding and Contraception condoms- discussed all options and declines currently.  Continue routine postpartum care.   #Headache post partum: resolved with ibuprofen.  #Elevated BP- BP overnight 130s and one 140/90 at 2000. Will start amlodipine 5mg . #Hypokalemia and hypomagnesemia- repleted on admission #Nutcracker syndrome- single kidney s/p removal. Cr appropriate at 0.59   LOS: 1 day   [facility] 11/25/2020, 8:00 AM   I saw and evaluated the patient. I agree with the findings and the plan of care as documented in the resident's note.  [facility], MD [facility] Family Medicine Fellow, [facility] for [facility], [facility]

## 2020-11-25 NOTE — Progress Notes (Signed)
Teaching service Midwife Cam Hai notified again about patients PRN oxycodone around 2330 on 1/26. The order was placed and patient was given pain medication.   Patient had order for scheduled Ibuprofen at 2100. The patient stated during initial  assessment that she was told by her nephrologist that she should not take NSAIDs and that she never takes ibuprofen at home. She stated that she would rather have the oxy for pain and not take the ibuprofen.  At patients 1st 4 hour check, she began complaining of a headache that got worse with changing position. Teaching service physician notified and information per patient from nephrologist given and physician stated that it was okay for her to have the ibuprofen, just to monitor closely the amounts that she was given. She also stated that she would notify the anesthesia about the headache so that they could follow up with the patient. Patient stated that she was okay with being given the Ibuprofen since the physician stated it was okay.

## 2020-11-25 NOTE — Lactation Note (Signed)
This note was copied from a baby's chart. Lactation Consultation Note Baby 11 hrs old at time of consult. Baby will not latch. RN has tried multiple times to latch baby. Mom has flat nipples at rest. Everts to short shaft not very compressible nipples. W/finger stimulation, reverse pressure, hand expression nipple everts and breast tissue softens. Attempted to latch baby but unable to. There was a #16 NS in room. It is to small. Mom could benefit from #20 NS. LC was going to give mom shells but mom doesn't have her bra.  Mom shown how to use DEBP & how to disassemble, clean, & reassemble parts.  Mom knows to pump q3h for 15-20 min.  Mom has hand pump in room. Encouraged to pre-pump before latching.  Mom encouraged to feed baby 8-12 times/24 hours and with feeding cues.  Newborn behavior, feeding habits, STS, I&O, positioning, support, supply and demand discussed. Mom asked LC to show her how to burp the baby. LC talked mom through burping the baby.  Mom has easily expressed colostrum. Praised mom. Milk storage reviewed. LC attempted to spoon feed baby. Baby took 1 ml w/much stimulation. Baby has been very spitty. LC used bulb syring once to clear out "bubbles" in her mouth.  LC is taking mess panties cut for mom to wear to hold shells onto breast. Mom could really benefit from wearing shells. Lactation brochure given.  Patient Name: Girl Lakeena Downie [id number] Date: 11/25/2020 Reason for consult: Initial assessment;Primapara;Early term 37-38.6wks Age:24 hours  Maternal Data Has patient been taught Hand Expression?: Yes Does the patient have breastfeeding experience prior to this delivery?: No  Feeding Feeding Type: Breast Milk  LATCH Score Latch: Too sleepy or reluctant, no latch achieved, no sucking elicited.  Audible Swallowing: None  Type of Nipple: Flat  Comfort (Breast/Nipple): Filling, red/small blisters or bruises, mild/mod discomfort (areola edema)  Hold  (Positioning): Full assist, staff holds infant at breast  LATCH Score: 2  Interventions Interventions: Breast feeding basics reviewed;Adjust position;DEBP;Assisted with latch;Support pillows;Skin to skin;Position options;Breast massage;Expressed milk;Hand express;Pre-pump if needed;Reverse pressure;Breast compression;Hand pump  Lactation Tools Discussed/Used Tools: Pump Shell Type: Inverted Breast pump type: Double-Electric Breast Pump WIC Program: No Pump Education: Setup, frequency, and cleaning;Milk Storage Initiated by:: Peri Jefferson RN IBCLC Date initiated:: 11/26/20   Consult Status Consult Status: Follow-up Date: 11/26/20 Follow-up type: In-patient    Charyl Dancer 11/25/2020, 6:08 AM

## 2020-11-25 NOTE — Lactation Note (Signed)
This note was copied from a baby's chart. Lactation Consultation Note  Patient Name: Barbara Reed Date: 11/25/2020 Reason for consult: Follow-up assessment Age:25 years  LC to room for f/u visit. Mom is able to latch baby today using a nipple shield and enticing with her colostrum. Mom is able to hand express copious amounts of colostrum and uses electric pump to help evert nipples. Mom is using a 70mm shield. Although she denies pain or difficulty with 86mm shield, she would likely benefit from increasing to next size. We discussed seeking outpatient f/u prn. Also discussed feeding and output expectations tonight. Parents are aware that [facility] and RN support is available prn. Parents were provided with the opportunity to ask questions. All concerns were addressed.  Will plan follow up visit.  Consult Status Consult Status: Follow-up Follow-up type: In-patient   Elder Negus, MA IBCLC 11/25/2020, 6:47 PM

## 2020-11-25 NOTE — Progress Notes (Signed)
Anesthesia Eval Called at 0301 by RN for postural headache. Pt delivered <9 hrs prior. Upon evaluation this AM. The patient states "Oh I'm fine. They gave me some ibuprofen and I'm fine." She denies any current headache, postural or otherwise. Normal motor and sensory function. Discussed that it would be rare to have PDPH this early (only ~18hrs) after epidural placement, and that she has many reasons to have a headache (eg blood loss from delivery, hypovolemia, etc). I encouraged fluids and said that if she had a recurrence of a postural headache that we would be back to see her.   Albertus Chiarelli

## 2020-11-25 NOTE — Anesthesia Postprocedure Evaluation (Signed)
Anesthesia Post Note  Patient: Barbara Reed  Procedure(s) Performed: AN AD HOC LABOR EPIDURAL     Patient location during evaluation: Mother Baby Anesthesia Type: Epidural Level of consciousness: awake and alert Pain management: pain level controlled Vital Signs Assessment: post-procedure vital signs reviewed and stable Respiratory status: spontaneous breathing, nonlabored ventilation and respiratory function stable Cardiovascular status: stable Postop Assessment: no headache, no backache and epidural receding Anesthetic complications: no   No complications documented.  Last Vitals:  Vitals:   11/25/20 0215 11/25/20 0620  BP: 109/82 106/68  Pulse: 97 82  Resp:    Temp: 37 C 36.9 C  SpO2: 96% 98%    Last Pain:  Vitals:   11/25/20 0620  TempSrc: Oral  PainSc: 0-No pain   Pain Goal:                   Junious Silk

## 2020-11-26 ENCOUNTER — Other Ambulatory Visit (HOSPITAL_COMMUNITY): Payer: Self-pay | Admitting: Family Medicine

## 2020-11-26 LAB — SURGICAL PATHOLOGY

## 2020-11-26 MED ORDER — AMLODIPINE BESYLATE 5 MG PO TABS: 5.0000 mg | ORAL_TABLET | Freq: Every day | ORAL | 0 refills | Status: DC

## 2020-11-26 MED ORDER — COCONUT OIL OIL: 1.0000 "application " | TOPICAL_OIL | 0 refills | Status: AC | PRN

## 2020-11-26 MED ORDER — IBUPROFEN 800 MG PO TABS: 800.0000 mg | ORAL_TABLET | Freq: Three times a day (TID) | ORAL | 0 refills | Status: DC | PRN

## 2020-11-26 MED FILL — IBUPROFEN 800 MG TAB: 800 | 10 days supply | Qty: 30 | Fill #0

## 2020-11-26 MED FILL — AMLODIPINE BESYLATE 5 MG TA: 5 | 30 days supply | Qty: 30 | Fill #0

## 2020-11-26 NOTE — Lactation Note (Addendum)
This note was copied from a baby's chart. Lactation Consultation Note  Patient Name: Barbara Reed [id number] Date: 11/26/2020 Reason for consult: Follow-up assessment Age:24 hours   Mother has been using a #16 NS, her nipples are pink and tender. I advised her not to use the NS any more.  Advised parents to get a #20 and a #24 NS from [employer] when they are discharged today. Advised mother to rest her nipples and bottle feed infant.  Mother placed in side lying position. Several attempts to latch infant on the bare breast.infant suckled frequently for several mins but short tugs on the nipple. No sustained latch.  Infant needs a void before discharge.  Mothers breast are filling firm and full.   Advised mother to massage breast and continue to pump every 3 hours  Plan of Care : Breastfeed infant with feeding cues Supplement infant with ebm/DBM, encouraged mother to offer infant as much as she will take. Pump using a DEBP after each feeding for 15-20 mins.   Mother to continue to cue base feed infant and feed at least 8-12 times or more in 24 hours and advised to allow for cluster feeding infant as needed.  Mother to continue to due STS. Mother is aware of available LC services at [facility], BFSG'S, OP Dept, and phone # for questions or concerns about breastfeeding.  Mother receptive to all teaching and plan of care.    Maternal Data    Feeding Feeding Type: Breast Milk Nipple Type: Slow - flow  LATCH Score                   Interventions    Lactation Tools Discussed/Used     Consult Status Consult Status: Follow-up Date: 11/26/20 Follow-up type: In-patient    Stevan Born [facility] 11/26/2020, 2:56 PM

## 2020-11-26 NOTE — Progress Notes (Signed)
CSW received and acknowledges consult for EDPS of 9.  Consult screened out due to 9 on EDPS does not warrant a CSW consult.  MOB whom scores are greater than 9/yes to question 10 on Edinburgh Postpartum Depression Screen warrants a CSW consult.    Paulanthony Gleaves S. Anahit Klumb, MSW, LCSW [facility] [phone]   

## 2020-11-29 ENCOUNTER — Encounter: Payer: Self-pay | Admitting: Family Medicine

## 2020-11-29 DIAGNOSIS — O411290 Chorioamnionitis, unspecified trimester, not applicable or unspecified: Secondary | ICD-10-CM | POA: Insufficient documentation

## 2020-12-01 ENCOUNTER — Telehealth (INDEPENDENT_AMBULATORY_CARE_PROVIDER_SITE_OTHER): Payer: 59

## 2020-12-01 DIAGNOSIS — I10 Essential (primary) hypertension: Secondary | ICD-10-CM | POA: Insufficient documentation

## 2020-12-01 DIAGNOSIS — Z0130 Encounter for examination of blood pressure without abnormal findings: Secondary | ICD-10-CM

## 2020-12-01 MED ORDER — AMLODIPINE BESYLATE 10 MG PO TABS: 10.0000 mg | ORAL_TABLET | Freq: Every day | ORAL | 2 refills | Status: AC

## 2020-12-01 NOTE — Progress Notes (Signed)
Chart reviewed for nurse visit. Agree with plan of care.   Venora Maples, MD 12/01/20 12:22 PM

## 2020-12-01 NOTE — Progress Notes (Addendum)
I connected with  Barbara Reed on 12/01/20 at 10:20 AM EST by Virtual My Chart in Office at [facility], Pt was home and verified that I am speaking with the correct person using two identifiers.   I discussed the limitations, risks, security and privacy concerns of performing an evaluation and management service by telephone and the availability of in person appointments. I also discussed with the patient that there may be a patient responsible charge related to this service. The patient expressed understanding and agreed to proceed.  [name], CMA 12/01/2020  10:30 AM

## 2020-12-01 NOTE — Progress Notes (Signed)
Pt visit today was for BP check, her BP today was 138/96, Pt currently taking Amlodipine 5 mg. Reviewed with Dr Crissie Reese and he advised for Pt to start 10 mg per day, advised with Pt & that will will review again at [facility] visit. Pt verbalized understanding.

## 2020-12-02 ENCOUNTER — Ambulatory Visit: Payer: 59 | Admitting: Cardiology

## 2020-12-10 ENCOUNTER — Telehealth: Payer: Self-pay | Admitting: *Deleted

## 2020-12-10 NOTE — Telephone Encounter (Signed)
Patient called and left a message on voicemail this am that she believes she has the beginning of mastitis and has fever less than 100 degrees. Wants to know if she needs prescription or to be seen.  I called Lanora Manis . She delivered 11/24/20. She reports she is pumping her breasts intially occasionally but now every 3 hours. She states she is not putting her baby to the breast - only feeding breastmilk by bottle. States she has more than enough milk for her baby. We discussed sometimes need to pump more often to meet needs of baby. She denies any red areas on her breasts. States breasts ( left)are tender to touch. States she has taken her temperature several times and the highest has been 100 .  She states she had a clogged duct on left but now breast is emptying but then immediately fills back up.  I advised continuing to take her temperature and if 100.4 call us , but if over the weekend go to Urgent care.  I discussed with Dr. Donavan Foil and no orders but plan as above.   She then reports she has been having headaches and elevated blood pressure. States today headache=4. States hasn't taken bp today but yesterday was 129/91. I instructed her to take blood pressure and asked if would do while I am on line , she declined. I advised her to take blood pressure and if high needs to go to hospital for evaluation for preeclampsia. I stressed this is very dangerous/ life threatening. She voices understanding. Janace Decker,RN

## 2020-12-30 ENCOUNTER — Telehealth (INDEPENDENT_AMBULATORY_CARE_PROVIDER_SITE_OTHER): Payer: 59 | Admitting: Obstetrics and Gynecology

## 2020-12-30 DIAGNOSIS — Z91199 Patient's noncompliance with other medical treatment and regimen due to unspecified reason: Secondary | ICD-10-CM | POA: Insufficient documentation

## 2020-12-30 DIAGNOSIS — Z5329 Procedure and treatment not carried out because of patient's decision for other reasons: Secondary | ICD-10-CM

## 2020-12-30 NOTE — Progress Notes (Signed)
11:40 Saylor not in virtual room for visit. I called  [facility] and heard a message " the mailbox is full and cannot accept messages. " Loman Logan,RN 11:43Saylor not in virtual room for visit. I called  [facility] and heard a message " the mailbox is full and cannot accept messages. " Shannen Flansburg,RN

## 2020-12-30 NOTE — Progress Notes (Signed)
Pt not reached for virtual post partum

## 2021-01-11 ENCOUNTER — Telehealth (INDEPENDENT_AMBULATORY_CARE_PROVIDER_SITE_OTHER): Payer: Self-pay

## 2021-01-11 DIAGNOSIS — Z5329 Procedure and treatment not carried out because of patient's decision for other reasons: Secondary | ICD-10-CM

## 2021-01-11 DIAGNOSIS — Z91199 Patient's noncompliance with other medical treatment and regimen due to unspecified reason: Secondary | ICD-10-CM

## 2021-01-11 DIAGNOSIS — O0990 Supervision of high risk pregnancy, unspecified, unspecified trimester: Secondary | ICD-10-CM

## 2021-01-11 DIAGNOSIS — Z538 Procedure and treatment not carried out for other reasons: Secondary | ICD-10-CM

## 2021-01-11 NOTE — Progress Notes (Signed)
3:52 Patient not connected virtually via MyChart video. I called her home/mobile number and was unable to leave a message- heard a message " mailbox is full". Illa Enlow,RN  4:05 Patient not connected virtually via MyChart video. I called her home/mobile number and was unable to leave a message- heard a message " mailbox is full". Eura Radabaugh,RN

## 2022-04-02 IMAGING — US US MFM OB FOLLOW-UP
1 series · 14 of 28 positions shown · non-contrast
Comparison: none

[Series 1: us mfm ob follow-up · 78 acquisitions, 14 frames shown]
[im 3/78]
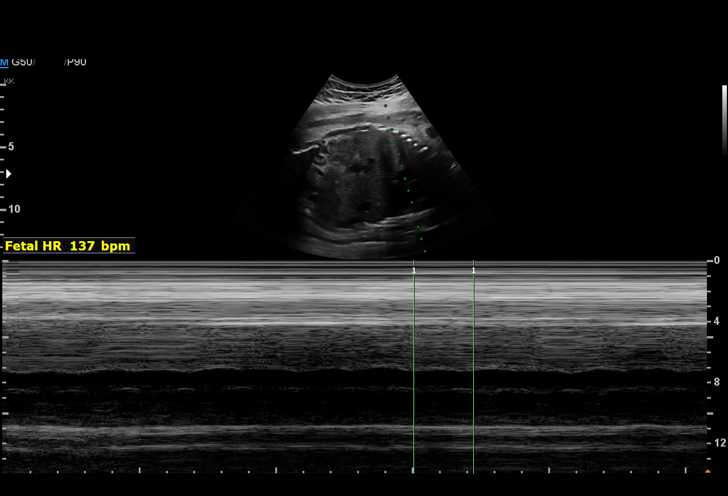
[im 9/78]
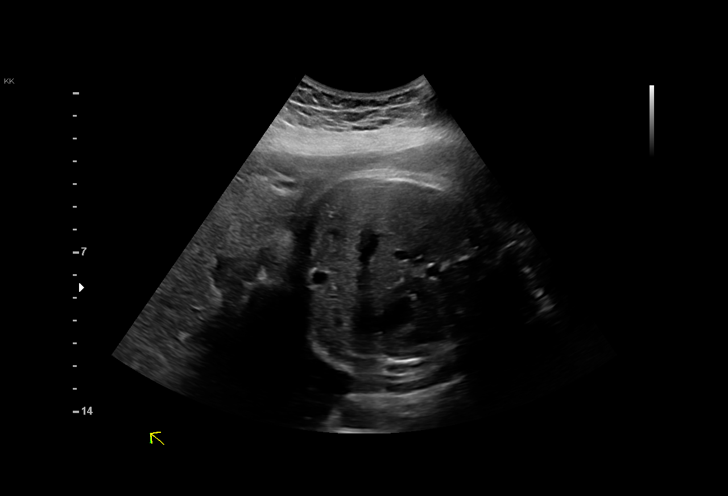
[im 15/78]
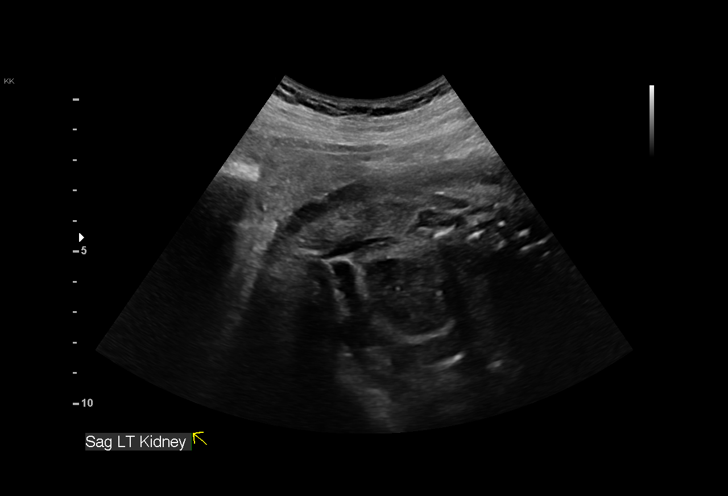
[im 20/78]
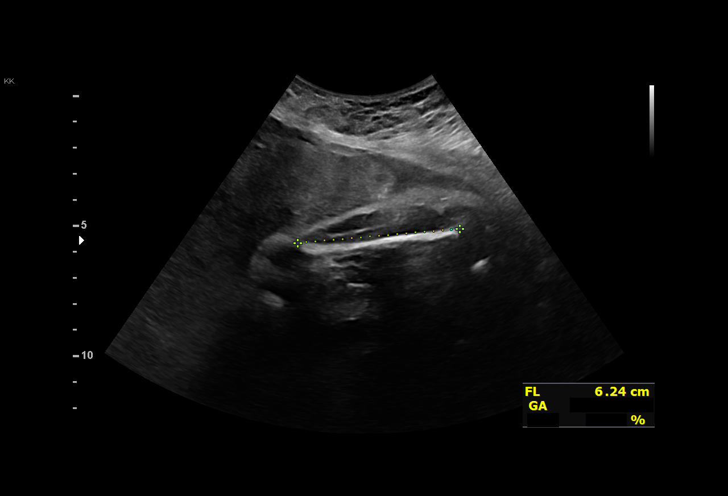
[im 26/78]
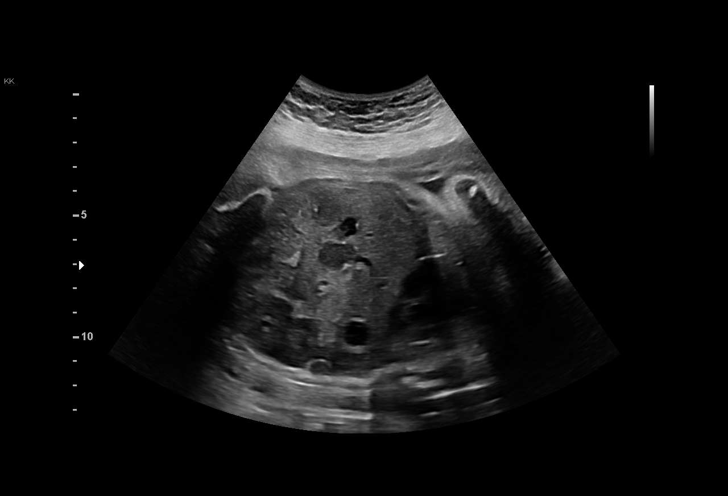
[im 32/78]
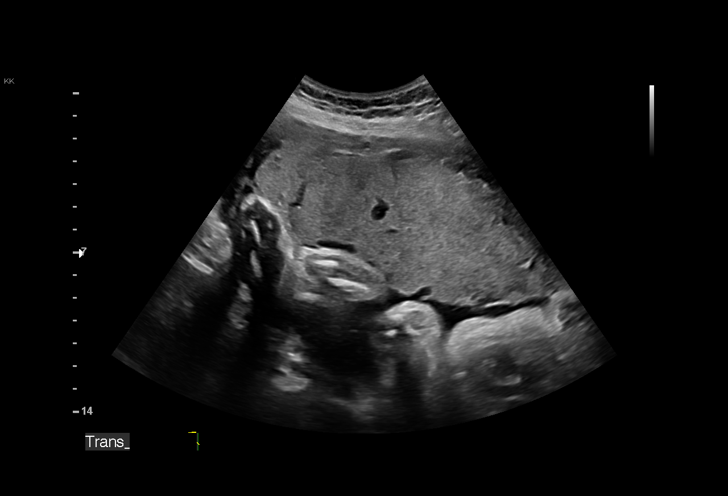
[im 38/78]
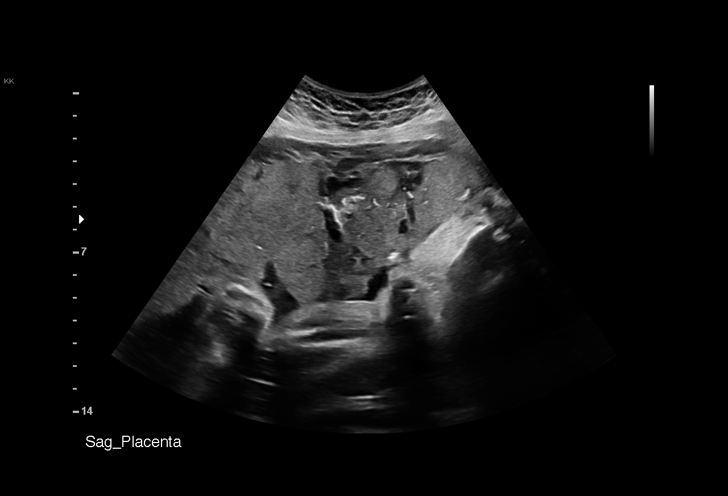
[im 43/78]
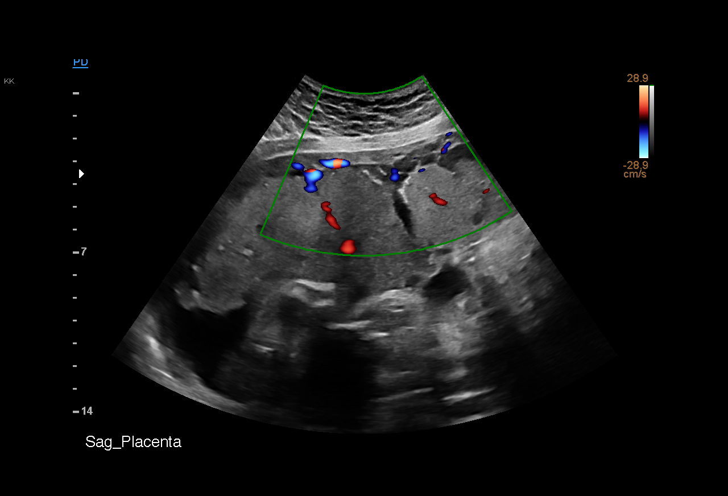
[im 49/78]
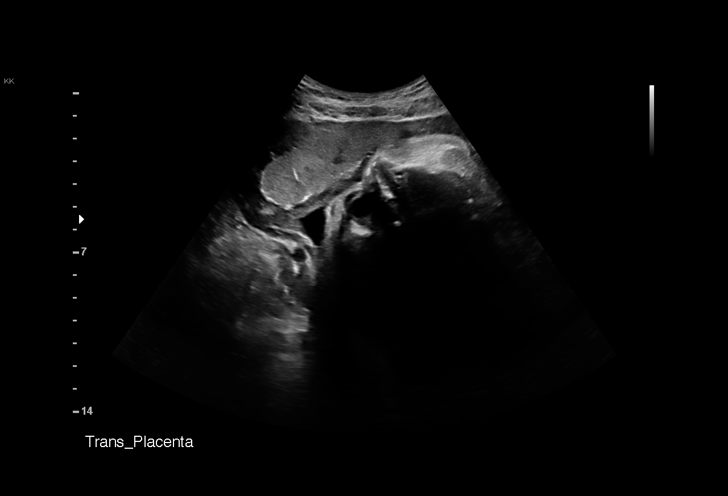
[im 55/78]
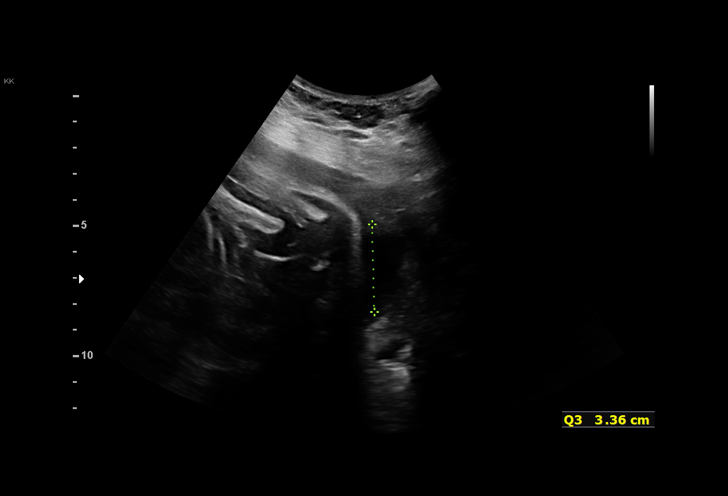
[im 60/78]
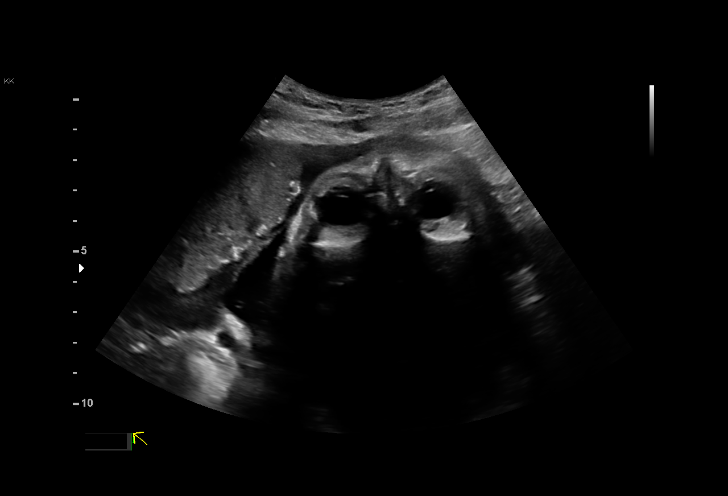
[im 66/78]
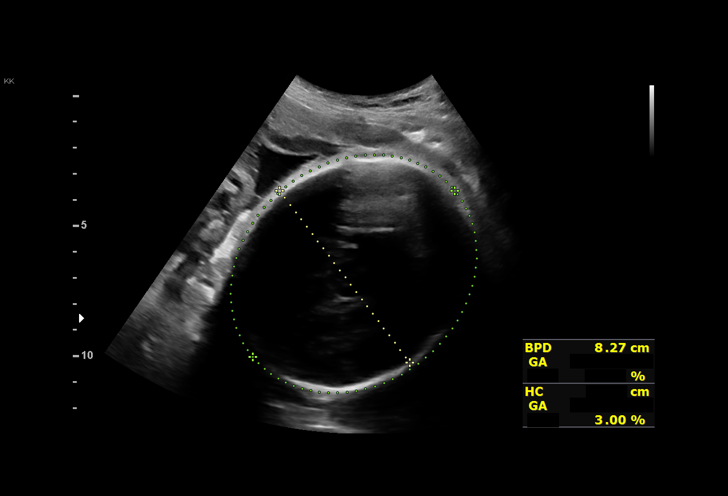
[im 72/78]
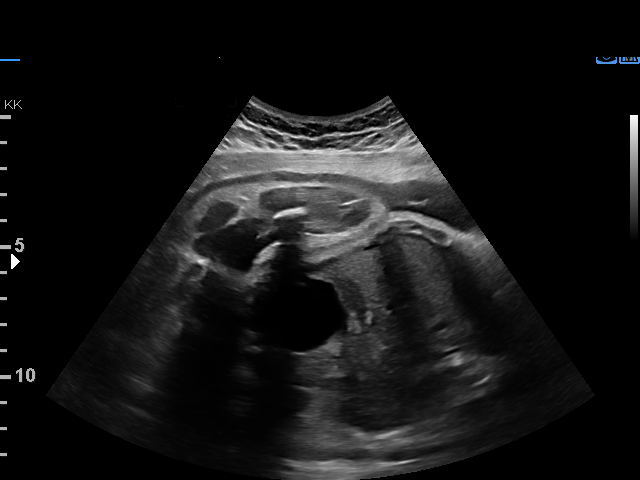
[im 78/78]
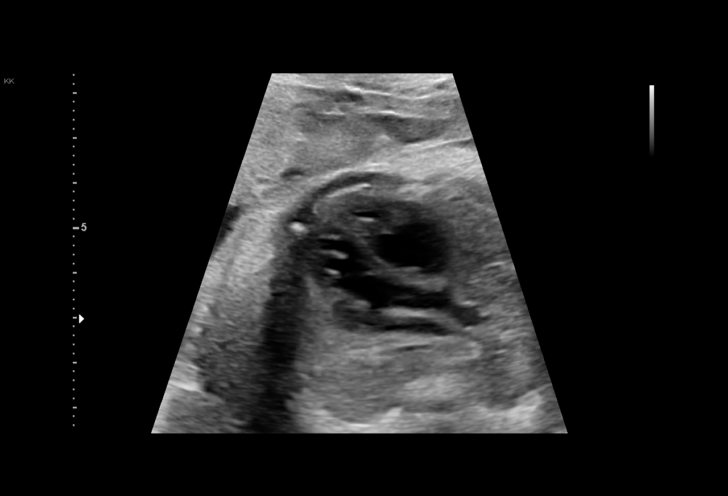

[14 of 28 positions shown; findings below may reference images not displayed]

Indications

 Poor obstetric history-Recurrent (habitual)
 abortion (3 consecutive ab's)
 Encounter for other antenatal screening
 follow-up
 33 weeks gestation of pregnancy
 History of Nephrectomy
Fetal Evaluation

 Num Of Fetuses:         1
 Fetal Heart Rate(bpm):  137
 Cardiac Activity:       Observed
 Presentation:           Cephalic
 Placenta:               Anterior
 P. Cord Insertion:      Previously Visualized

 Amniotic Fluid
 AFI FV:      Within normal limits

 AFI Sum(cm)     %Tile       Largest Pocket(cm)
 9.9             17

 RUQ(cm)       RLQ(cm)       LUQ(cm)        LLQ(cm)

Biometry
 BPD:      83.1  mm     G. Age:  33w 3d         49  %    CI:        81.27   %    70 - 86
                                                         FL/HC:      21.0   %    19.9 -
 HC:       291   mm     G. Age:  32w 0d        2.5  %    HC/AC:      0.97        0.96 -
 AC:      301.1  mm     G. Age:  34w 1d         74  %    FL/BPD:     73.5   %    71 - 87
 FL:       61.1  mm     G. Age:  31w 5d          8  %    FL/AC:      20.3   %    20 - 24

 Est. FW:    6667  gm    4 lb 11 oz      37  %
OB History

 Gravidity:    5          SAB:   4
 Living:       0
Gestational Age

 U/S Today:     32w 6d                                        EDD:   12/08/20
 Best:          33w 2d     Det. By:  Previous Ultrasound      EDD:   12/05/20
                                     (04/20/20)
Anatomy

 Cranium:               Appears normal         Aortic Arch:            Previously seen
 Cavum:                 Previously seen        Ductal Arch:            Previously seen
 Ventricles:            Appears normal         Diaphragm:              Appears normal
 Choroid Plexus:        Previously seen        Stomach:                Appears normal, left
                                                                       sided
 Cerebellum:            Previously seen        Abdomen:                Appears normal
 Posterior Fossa:       Previously seen        Abdominal Wall:         Previously seen
 Nuchal Fold:           Not applicable (>20    Cord Vessels:           Previously seen
                        wks GA)
 Face:                  Orbits and profile     Kidneys:                Appear normal
                        previously seen
 Lips:                  Previously seen        Bladder:                Appears normal
 Thoracic:              Appears normal         Spine:                  Previously seen
 Heart:                 Appears normal;        Upper Extremities:      Previously seen
                        EIF prev.
 RVOT:                  Previously seen        Lower Extremities:      Previously seen
 LVOT:                  Previously seen

 Other:  Fetus appears to be female.
Cervix Uterus Adnexa

 Cervix
 Not visualized (advanced GA >96wks)
Impression

 Patient returns for fetal growth assessment.She had
 Nutcracker syndrome (compression of left renal vein between
 abdominal aorta and superior mesenteric artery).  Renal
 vascular interventions failed  to alleviate her symptoms.
 Patient donated her left kidney.

 She does not have gestational diabetes.  Blood pressure
 today at her office is 136/78 mmHg.

 Amniotic fluid is normal and good fetal activity is seen .Fetal
 growth is appropriate for gestational age .
 We reassured the patient of the findings
Recommendations

 Follow-up scans as clinically indicated.
                 Chomenko, Juozasdvilevicius
